# Patient Record
Sex: Female | Born: 1970 | Race: White | Hispanic: No | Marital: Married | Smoking: Never smoker
Health system: Southern US, Community
[De-identification: ages and names within clinical notes are randomized; demographics above are authoritative.]

## PROBLEM LIST (undated history)

## (undated) DIAGNOSIS — B9681 Helicobacter pylori [H. pylori] as the cause of diseases classified elsewhere: Secondary | ICD-10-CM

## (undated) DIAGNOSIS — F329 Major depressive disorder, single episode, unspecified: Secondary | ICD-10-CM

## (undated) DIAGNOSIS — F32A Depression, unspecified: Secondary | ICD-10-CM

## (undated) DIAGNOSIS — G43909 Migraine, unspecified, not intractable, without status migrainosus: Secondary | ICD-10-CM

## (undated) DIAGNOSIS — D039 Melanoma in situ, unspecified: Secondary | ICD-10-CM

## (undated) DIAGNOSIS — K279 Peptic ulcer, site unspecified, unspecified as acute or chronic, without hemorrhage or perforation: Secondary | ICD-10-CM

## (undated) DIAGNOSIS — C801 Malignant (primary) neoplasm, unspecified: Secondary | ICD-10-CM

---

## 2008-03-04 ENCOUNTER — Encounter: Payer: Self-pay | Admitting: Family Medicine

## 2008-06-03 ENCOUNTER — Ambulatory Visit: Payer: Self-pay | Admitting: Family Medicine

## 2008-06-03 ENCOUNTER — Encounter: Admission: RE | Admit: 2008-06-03 | Discharge: 2008-06-03 | Payer: Self-pay | Admitting: Family Medicine

## 2008-06-03 DIAGNOSIS — F329 Major depressive disorder, single episode, unspecified: Secondary | ICD-10-CM

## 2008-06-03 DIAGNOSIS — L659 Nonscarring hair loss, unspecified: Secondary | ICD-10-CM | POA: Insufficient documentation

## 2008-06-03 DIAGNOSIS — R5381 Other malaise: Secondary | ICD-10-CM

## 2008-06-03 DIAGNOSIS — R5383 Other fatigue: Secondary | ICD-10-CM

## 2008-06-03 DIAGNOSIS — F3289 Other specified depressive episodes: Secondary | ICD-10-CM | POA: Insufficient documentation

## 2008-06-03 DIAGNOSIS — G43009 Migraine without aura, not intractable, without status migrainosus: Secondary | ICD-10-CM | POA: Insufficient documentation

## 2008-06-04 LAB — CONVERTED CEMR LAB
CO2: 25 meq/L (ref 19–32)
Creatinine, Ser: 0.84 mg/dL (ref 0.40–1.20)
Glucose, Bld: 88 mg/dL (ref 70–99)
Sodium: 140 meq/L (ref 135–145)
TSH: 1.204 microintl units/mL (ref 0.350–4.50)

## 2008-06-18 ENCOUNTER — Ambulatory Visit (HOSPITAL_COMMUNITY): Payer: Self-pay | Admitting: Psychology

## 2008-06-23 ENCOUNTER — Ambulatory Visit (HOSPITAL_COMMUNITY): Payer: Self-pay | Admitting: Psychology

## 2008-07-01 ENCOUNTER — Encounter: Admission: RE | Admit: 2008-07-01 | Discharge: 2008-07-01 | Payer: Self-pay | Admitting: Dermatology

## 2008-07-01 ENCOUNTER — Ambulatory Visit: Payer: Self-pay | Admitting: Family Medicine

## 2008-07-09 ENCOUNTER — Ambulatory Visit (HOSPITAL_COMMUNITY): Payer: Self-pay | Admitting: Psychology

## 2008-07-12 ENCOUNTER — Telehealth: Payer: Self-pay | Admitting: Family Medicine

## 2008-07-13 ENCOUNTER — Ambulatory Visit: Payer: Self-pay | Admitting: Family Medicine

## 2008-07-13 DIAGNOSIS — R42 Dizziness and giddiness: Secondary | ICD-10-CM

## 2008-07-13 LAB — CONVERTED CEMR LAB: Hemoglobin: 15.3 g/dL

## 2008-07-16 ENCOUNTER — Encounter: Payer: Self-pay | Admitting: Family Medicine

## 2008-07-19 ENCOUNTER — Telehealth: Payer: Self-pay | Admitting: Family Medicine

## 2008-07-21 ENCOUNTER — Ambulatory Visit (HOSPITAL_COMMUNITY): Payer: Self-pay | Admitting: Psychology

## 2008-07-30 ENCOUNTER — Ambulatory Visit (HOSPITAL_COMMUNITY): Payer: Self-pay | Admitting: Psychology

## 2008-08-05 ENCOUNTER — Ambulatory Visit: Payer: Self-pay | Admitting: Family Medicine

## 2008-08-09 ENCOUNTER — Ambulatory Visit (HOSPITAL_COMMUNITY): Payer: Self-pay | Admitting: Psychology

## 2008-08-14 ENCOUNTER — Encounter: Admission: RE | Admit: 2008-08-14 | Discharge: 2008-08-14 | Payer: Self-pay | Admitting: Neurology

## 2008-08-26 ENCOUNTER — Ambulatory Visit (HOSPITAL_COMMUNITY): Payer: Self-pay | Admitting: Psychology

## 2008-09-28 ENCOUNTER — Ambulatory Visit (HOSPITAL_COMMUNITY): Payer: Self-pay | Admitting: Psychology

## 2008-10-12 ENCOUNTER — Ambulatory Visit (HOSPITAL_COMMUNITY): Payer: Self-pay | Admitting: Psychology

## 2008-11-03 ENCOUNTER — Ambulatory Visit: Payer: Self-pay | Admitting: Family Medicine

## 2008-12-09 ENCOUNTER — Ambulatory Visit (HOSPITAL_COMMUNITY): Payer: Self-pay | Admitting: Psychology

## 2008-12-29 ENCOUNTER — Ambulatory Visit (HOSPITAL_COMMUNITY): Payer: Self-pay | Admitting: Psychology

## 2009-01-05 ENCOUNTER — Telehealth (INDEPENDENT_AMBULATORY_CARE_PROVIDER_SITE_OTHER): Payer: Self-pay | Admitting: *Deleted

## 2009-01-11 ENCOUNTER — Ambulatory Visit: Payer: Self-pay | Admitting: Family Medicine

## 2009-01-12 LAB — CONVERTED CEMR LAB
Eosinophils Absolute: 0 10*3/uL (ref 0.0–0.7)
Ferritin: 33.4 ng/mL (ref 10.0–291.0)
Folate: 6 ng/mL
Free T4: 0.6 ng/dL (ref 0.6–1.6)
HCT: 41.3 % (ref 36.0–46.0)
Hemoglobin: 14.7 g/dL (ref 12.0–15.0)
Lymphocytes Relative: 24.6 % (ref 12.0–46.0)
MCV: 89.9 fL (ref 78.0–100.0)
Platelets: 152 10*3/uL (ref 150.0–400.0)
RBC: 4.59 M/uL (ref 3.87–5.11)
Saturation Ratios: 23.9 % (ref 20.0–50.0)
T3, Free: 2.8 pg/mL (ref 2.3–4.2)
TSH: 1.41 microintl units/mL (ref 0.35–5.50)
Transferrin: 200.1 mg/dL — ABNORMAL LOW (ref 212.0–360.0)

## 2009-01-14 ENCOUNTER — Ambulatory Visit (HOSPITAL_COMMUNITY): Payer: Self-pay | Admitting: Psychology

## 2009-01-24 ENCOUNTER — Other Ambulatory Visit (HOSPITAL_COMMUNITY): Admission: RE | Admit: 2009-01-24 | Discharge: 2009-02-01 | Payer: Self-pay | Admitting: Psychiatry

## 2009-01-25 ENCOUNTER — Telehealth: Payer: Self-pay | Admitting: Family Medicine

## 2009-01-26 ENCOUNTER — Ambulatory Visit: Payer: Self-pay | Admitting: Psychiatry

## 2009-02-03 ENCOUNTER — Ambulatory Visit (HOSPITAL_COMMUNITY): Payer: Self-pay | Admitting: Psychology

## 2009-03-29 ENCOUNTER — Ambulatory Visit (HOSPITAL_COMMUNITY): Payer: Self-pay | Admitting: Psychology

## 2009-04-13 ENCOUNTER — Ambulatory Visit (HOSPITAL_COMMUNITY): Payer: Self-pay | Admitting: Psychology

## 2009-04-27 ENCOUNTER — Ambulatory Visit (HOSPITAL_COMMUNITY): Payer: Self-pay | Admitting: Psychology

## 2009-05-10 ENCOUNTER — Ambulatory Visit (HOSPITAL_COMMUNITY): Payer: Self-pay | Admitting: Psychology

## 2009-06-07 ENCOUNTER — Ambulatory Visit (HOSPITAL_COMMUNITY): Payer: Self-pay | Admitting: Psychology

## 2009-06-28 ENCOUNTER — Ambulatory Visit (HOSPITAL_COMMUNITY): Payer: Self-pay | Admitting: Psychology

## 2009-07-28 ENCOUNTER — Ambulatory Visit (HOSPITAL_COMMUNITY): Payer: Self-pay | Admitting: Psychology

## 2009-08-09 ENCOUNTER — Ambulatory Visit (HOSPITAL_COMMUNITY): Payer: Self-pay | Admitting: Psychology

## 2009-09-07 ENCOUNTER — Ambulatory Visit (HOSPITAL_COMMUNITY): Payer: Self-pay | Admitting: Psychology

## 2009-09-15 ENCOUNTER — Encounter: Admission: RE | Admit: 2009-09-15 | Discharge: 2009-09-15 | Payer: Self-pay | Admitting: Dermatology

## 2009-09-28 ENCOUNTER — Ambulatory Visit (HOSPITAL_COMMUNITY): Payer: Self-pay | Admitting: Psychology

## 2009-10-27 ENCOUNTER — Ambulatory Visit (HOSPITAL_COMMUNITY): Payer: Self-pay | Admitting: Psychology

## 2010-02-08 IMAGING — CR DG CHEST 2V
2 series · 2 of 2 positions shown · non-contrast
Comparison: 

CLINICAL DATA: Melanoma

CHEST - 2 VIEW

[view not recorded (1 of 2)]
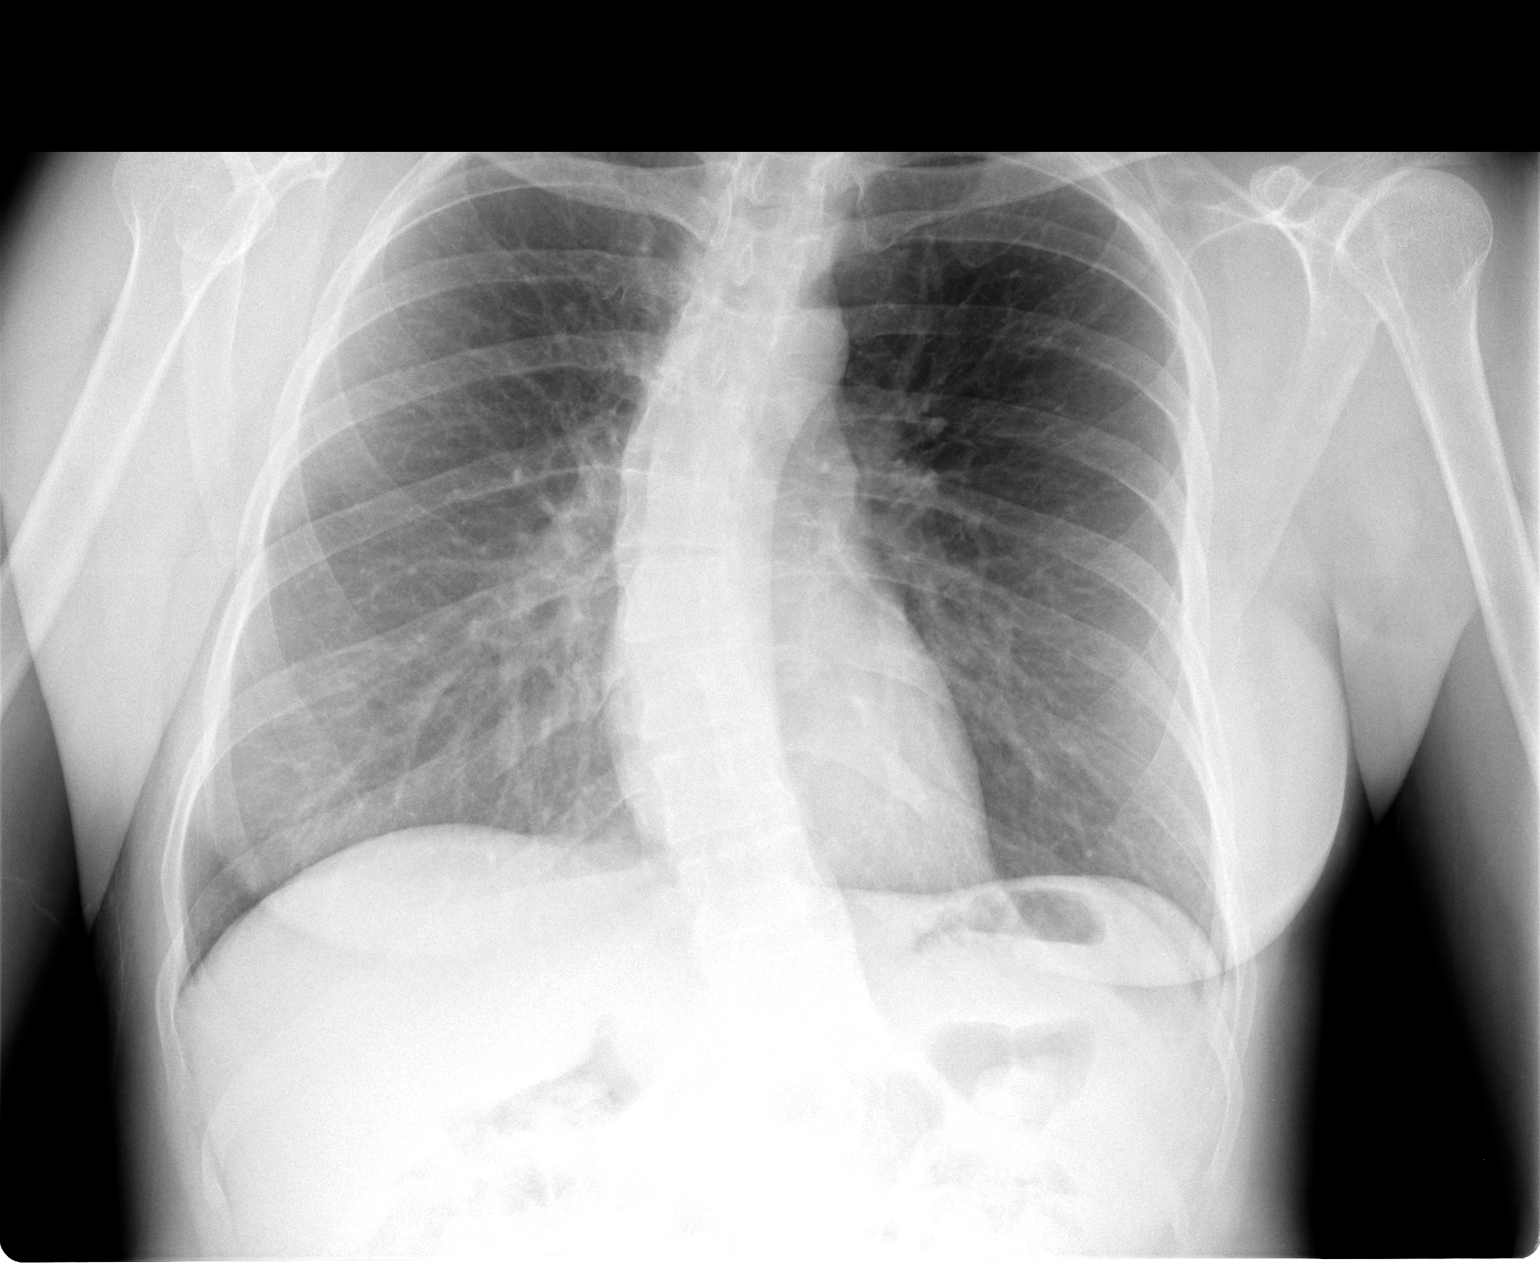

[view not recorded (2 of 2)]
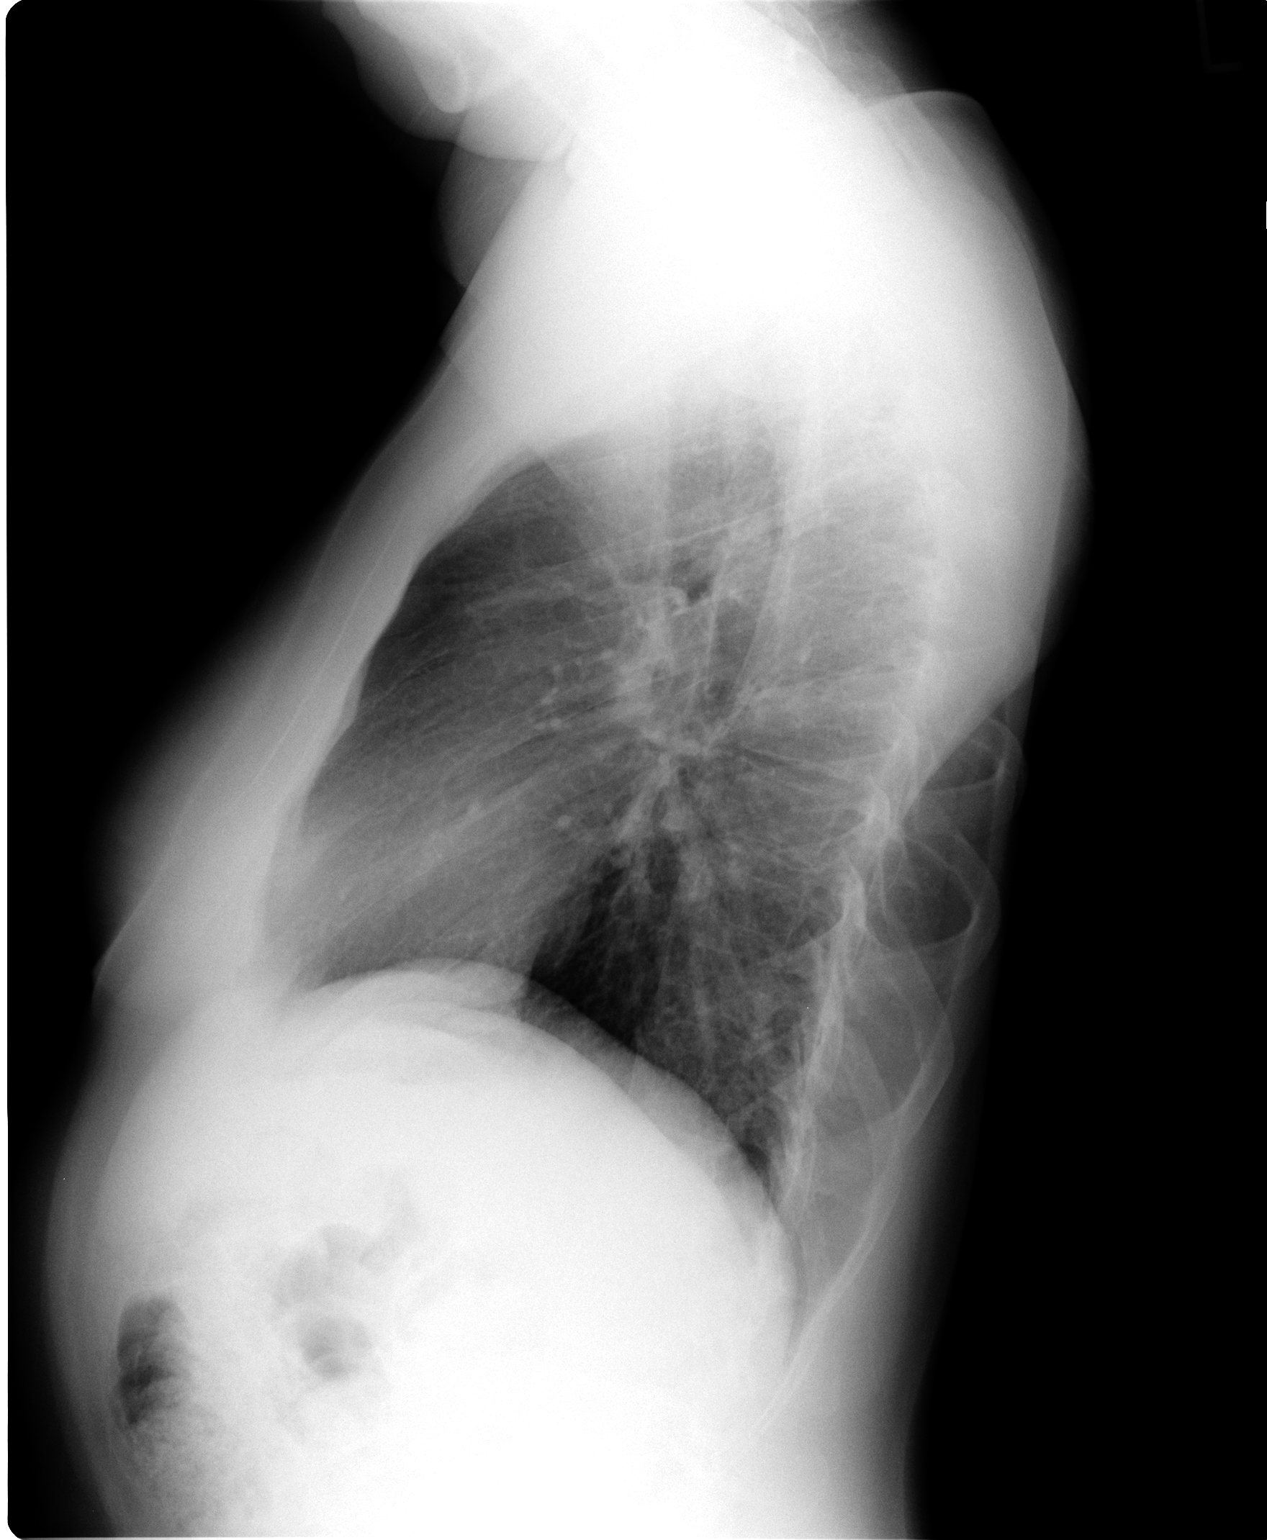

[2 of 2 positions shown; findings below may reference images not displayed]

FINDINGS: The heart size and mediastinal contours are within normal
limits.  Both lungs are clear. No nodules or masses. Thoracic
dextroscoliosis.
IMPRESSION: No active cardiopulmonary disease.

## 2010-02-24 ENCOUNTER — Encounter: Admission: RE | Admit: 2010-02-24 | Discharge: 2010-02-24 | Payer: Self-pay | Admitting: Dermatology

## 2010-02-27 ENCOUNTER — Ambulatory Visit (HOSPITAL_COMMUNITY): Payer: Self-pay | Admitting: Psychology

## 2010-03-06 ENCOUNTER — Encounter: Payer: Self-pay | Admitting: Family Medicine

## 2010-03-07 ENCOUNTER — Ambulatory Visit (HOSPITAL_COMMUNITY): Payer: Self-pay | Admitting: Psychology

## 2010-03-27 ENCOUNTER — Encounter: Payer: Self-pay | Admitting: Family Medicine

## 2010-04-04 ENCOUNTER — Ambulatory Visit (HOSPITAL_COMMUNITY): Payer: Self-pay | Admitting: Psychology

## 2010-05-18 ENCOUNTER — Encounter: Payer: Self-pay | Admitting: Family Medicine

## 2010-05-31 ENCOUNTER — Encounter: Payer: Self-pay | Admitting: Family Medicine

## 2010-07-19 ENCOUNTER — Encounter: Payer: Self-pay | Admitting: Family Medicine

## 2010-09-19 NOTE — Letter (Signed)
Summary: [facility]  [facility]   Imported By: Lanelle Bal 04/04/2010 12:07:03  _____________________________________________________________________  External Attachment:    Type:   Image     Comment:   External Document

## 2010-09-19 NOTE — Letter (Signed)
Summary: [facility]  [facility]   Imported By: Lanelle Bal 06/02/2010 10:29:11  _____________________________________________________________________  External Attachment:    Type:   Image     Comment:   External Document

## 2010-09-19 NOTE — Letter (Signed)
Summary: [facility]   Imported By: Maryln Gottron 07/28/2010 12:21:14  _____________________________________________________________________  External Attachment:    Type:   Image     Comment:   External Document

## 2010-09-19 NOTE — Miscellaneous (Signed)
Summary: colonoscopy  Clinical Lists Changes  Observations: Added new observation of COLONRECACT: Repeat colonoscopy in 5 years.  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    o/w normal.   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    o/w normal.    Comments:      Repeat colonoscopy in 5 years.    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    o/w normal.    Comments:      Repeat colonoscopy in 5 years.

## 2010-09-19 NOTE — Letter (Signed)
Summary: [facility]  [facility]   Imported By: Lanelle Bal 04/19/2010 14:10:48  _____________________________________________________________________  External Attachment:    Type:   Image     Comment:   External Document

## 2010-09-28 ENCOUNTER — Encounter: Payer: Self-pay | Admitting: Family Medicine

## 2010-09-29 ENCOUNTER — Encounter: Payer: Self-pay | Admitting: Family Medicine

## 2010-09-29 ENCOUNTER — Ambulatory Visit (INDEPENDENT_AMBULATORY_CARE_PROVIDER_SITE_OTHER): Payer: BC Managed Care – PPO | Admitting: Family Medicine

## 2010-09-29 DIAGNOSIS — J209 Acute bronchitis, unspecified: Secondary | ICD-10-CM | POA: Insufficient documentation

## 2010-10-05 NOTE — Assessment & Plan Note (Signed)
Summary: bronchitis   Vital Signs:  Patient profile:   40 year old female Height:      61.5 inches Weight:      148 pounds BMI:     27.61 O2 Sat:      98 % on Room air Temp:     98.5 degrees F oral Pulse rate:   74 / minute BP sitting:   105 / 75  (left arm) Cuff size:   regular  Vitals Entered By: Payton Spark CMA (September 29, 2010 1:47 PM)  O2 Flow:  Room air CC: Cough x 1 week.    Primary Care Provider:  Seymour Bars DO  CC:  Cough x 1 week. Marland Kitchen  History of Present Illness: 40 yo WF presents for a cough that started 7 days ago.  She felt bad at the onset iwth sore throat, congestion and malaise.  She ran on Sunday and started to cough deeper on Monday.  her cough became productive on Monday but she o/w felt OK.  She started to have more frequent cough yesterday with poor sleep last night.  She took Robitussin which has helped some.  She has some chest tightness and rib pain iwth coughing but no SOB.  She is not a smoker and has never had asthma.  Has some rhinorrhea but not much head congestion.  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  Current Medications (verified): 1)  Spironolactone 50 Mg Tabs (Spironolactone) .... Take 1 Tablet By Mouth Once A Day 2)  Zyrtec Allergy 10 Mg Tabs (Cetirizine Hcl) .... Take 1 Tablet By Mouth Once A Day 3)  Vitamin E Complex 400 Unit Caps (Vitamin E) .... Take 1 Tablet By Mouth Once A Day 4)  Maxalt 10 Mg Tabs (Rizatriptan Benzoate) .... Take One By Mouth For Migraine,may Repeat Times One in 2hours If Needed 5)  B2 100 Mg Tabs (Riboflavin) .... Take Two By Orbie Hurst Twice Daily 6)  Melatonin 1 Mg Caps (Melatonin) .... 1/2 Tab By Mouth Qam 7)  Pristiq 100 Mg Xr24h-Tab (Desvenlafaxine Succinate) .... Take 1 Tab By Mouth Once Daily 8)  Abilify 5 Mg Tabs (Aripiprazole) .... Take 1 Tab By Mouth At Bedtime 9)  Zonegran 100 Mg Caps (Zonisamide) .... Take 1 Cap By Mouth At  Bedtime 10)  Asacol Hd 800 Mg Tbec (Mesalamine) .... Take 1 Tab By Mouth Three Times A Day  Allergies (verified): No Known Drug Allergies  Past History:  Past Medical History: Reviewed history from 11/03/2008 and no changes required. 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 Rubie Maid person. Has twin sons, married.   9-06 Mirena placed.  Review of Systems      See HPI  Physical Exam  General:  alert, well-developed, well-nourished, and well-hydrated.   Head:  normocephalic and atraumatic.   Eyes:  conjunctiva clear Ears:  EACs patent; TMs translucent and gray with good cone of light and bony landmarks.  Nose:  nasal congestion present with scant rhinorrhea Mouth:  o/p with minimal injection Neck:  no masses.   Lungs:  Normal respiratory effort, chest expands symmetrically. Lungs are clear to auscultation, no crackles or wheezes.  L basilar rhonchi esp with  cough Heart:  Normal rate and regular rhythm. S1 and S2 normal without gallop, murmur, click, rub or other extra sounds. Skin:  color normal and no rashes.   Cervical Nodes:  No lymphadenopathy noted   Impression & Recommendations:  Problem # 1:  ACUTE BRONCHITIS (ICD-466.0) Bronchitis likely viral, day 7-8 of illness.  Treat with supportive care, adding nighttime cough mediicine.  If not improved in another 4-5 days, RX for Zithromax given to start.   Her updated medication list for this problem includes:    Zithromax Z-pak 250 Mg Tabs (Azithromycin) .Marland Kitchen... 2 tabs by mouth once daily x 1 day then 1 tab by mouth daily x 4 days    Tussionex Pennkinetic Er 10-8 Mg/72ml Lqcr (Hydrocod polst-chlorphen polst) .Marland Kitchen... 1 teaspoon by mouth at bedtime as needed cough  Complete Medication List: 1)  Spironolactone  50 Mg Tabs (Spironolactone) .... Take 1 tablet by mouth once a day 2)  Zyrtec Allergy 10 Mg Tabs (Cetirizine hcl) .... Take 1 tablet by mouth once a day 3)  Vitamin E Complex 400 Unit Caps (Vitamin e) .... Take 1 tablet by mouth once a day 4)  Maxalt 10 Mg Tabs (Rizatriptan benzoate) .... Take one by mouth for migraine,may repeat times one in 2hours if needed 5)  B2 100 Mg Tabs (Riboflavin) .... Take two by moth twice daily 6)  Melatonin 1 Mg Caps (Melatonin) .... 1/2 tab by mouth qam 7)  Pristiq 100 Mg Xr24h-tab (Desvenlafaxine succinate) .... Take 1 tab by mouth once daily 8)  Abilify 5 Mg Tabs (Aripiprazole) .... Take 1 tab by mouth at bedtime 9)  Zonegran 100 Mg Caps (Zonisamide) .... Take 1 cap by mouth at bedtime 10)  Asacol Hd 800 Mg Tbec (Mesalamine) .... Take 1 tab by mouth three times a day 11)  Zithromax Z-pak 250 Mg Tabs (Azithromycin) .... 2 tabs by mouth once daily x 1 day then 1 tab by mouth daily x 4 days 12)  Tussionex Pennkinetic Er 10-8 Mg/62ml Lqcr (Hydrocod polst-chlorphen polst) .Marland Kitchen.. 1 teaspoon by mouth at bedtime as needed cough  Patient Instructions: 1)  Take 5 days of Zithromax for bronchitis. 2)  Use Tussionex at night for cough and Delsym during the day. 3)  Call if not improved in 10 days. Prescriptions: TUSSIONEX PENNKINETIC ER 10-8 MG/5ML LQCR (HYDROCOD POLST-CHLORPHEN POLST) 1 teaspoon by mouth at bedtime as needed cough  #100 ml x 0   Entered and Authorized by:   Seymour Bars DO   Signed by:   Seymour Bars DO on 09/29/2010   Method used:   Printed then faxed to ...       [facility]* (retail)       [address]       [place], [place]  [address]       Ph: [phone]       Fax: [phone]   RxID:   [id number] ZITHROMAX Z-PAK 250 MG TABS (AZITHROMYCIN) 2 tabs by mouth once daily x 1 day then 1 tab by mouth daily x 4 days  #1 pack x 0   Entered and Authorized by:   Seymour Bars DO   Signed by:   Seymour Bars DO on  09/29/2010   Method used:   Electronically to        [facility]* (retail)       [address]       [employer]  [address]       [place], [place]  [address]       Ph: [phone]       Fax: [phone]   RxID:   [id number]    Orders Added: 1)  Est. Patient Level III [84132]

## 2010-10-17 NOTE — Letter (Signed)
Summary: [facility]  [facility]   Imported By: Lanelle Bal 10/11/2010 13:37:04  _____________________________________________________________________  External Attachment:    Type:   Image     Comment:   External Document

## 2011-04-11 ENCOUNTER — Other Ambulatory Visit: Payer: Self-pay | Admitting: Dermatology

## 2011-04-11 ENCOUNTER — Ambulatory Visit
Admission: RE | Admit: 2011-04-11 | Discharge: 2011-04-11 | Disposition: A | Discharge status: Home / Self Care | Payer: BC Managed Care – PPO | Source: Ambulatory Visit | Attending: Dermatology | Admitting: Dermatology

## 2011-04-11 DIAGNOSIS — C439 Malignant melanoma of skin, unspecified: Secondary | ICD-10-CM

## 2013-08-29 ENCOUNTER — Emergency Department (HOSPITAL_BASED_OUTPATIENT_CLINIC_OR_DEPARTMENT_OTHER)
Admission: EM | Admit: 2013-08-29 | Discharge: 2013-08-29 | Disposition: A | Discharge status: Home / Self Care | Payer: BC Managed Care – PPO | Attending: Emergency Medicine | Admitting: Emergency Medicine

## 2013-08-29 ENCOUNTER — Emergency Department (HOSPITAL_BASED_OUTPATIENT_CLINIC_OR_DEPARTMENT_OTHER): Payer: BC Managed Care – PPO

## 2013-08-29 ENCOUNTER — Encounter (HOSPITAL_BASED_OUTPATIENT_CLINIC_OR_DEPARTMENT_OTHER): Payer: Self-pay | Admitting: Emergency Medicine

## 2013-08-29 DIAGNOSIS — G43909 Migraine, unspecified, not intractable, without status migrainosus: Secondary | ICD-10-CM | POA: Insufficient documentation

## 2013-08-29 DIAGNOSIS — F3289 Other specified depressive episodes: Secondary | ICD-10-CM | POA: Insufficient documentation

## 2013-08-29 DIAGNOSIS — K56609 Unspecified intestinal obstruction, unspecified as to partial versus complete obstruction: Secondary | ICD-10-CM | POA: Insufficient documentation

## 2013-08-29 DIAGNOSIS — Z8619 Personal history of other infectious and parasitic diseases: Secondary | ICD-10-CM | POA: Insufficient documentation

## 2013-08-29 DIAGNOSIS — F329 Major depressive disorder, single episode, unspecified: Secondary | ICD-10-CM | POA: Insufficient documentation

## 2013-08-29 DIAGNOSIS — Z85820 Personal history of malignant melanoma of skin: Secondary | ICD-10-CM | POA: Insufficient documentation

## 2013-08-29 DIAGNOSIS — Z79899 Other long term (current) drug therapy: Secondary | ICD-10-CM | POA: Insufficient documentation

## 2013-08-29 DIAGNOSIS — K50012 Crohn's disease of small intestine with intestinal obstruction: Secondary | ICD-10-CM

## 2013-08-29 DIAGNOSIS — Z3202 Encounter for pregnancy test, result negative: Secondary | ICD-10-CM | POA: Insufficient documentation

## 2013-08-29 DIAGNOSIS — K5000 Crohn's disease of small intestine without complications: Secondary | ICD-10-CM | POA: Insufficient documentation

## 2013-08-29 HISTORY — DX: Peptic ulcer, site unspecified, unspecified as acute or chronic, without hemorrhage or perforation: K27.9

## 2013-08-29 HISTORY — DX: Major depressive disorder, single episode, unspecified: F32.9

## 2013-08-29 HISTORY — DX: Depression, unspecified: F32.A

## 2013-08-29 HISTORY — DX: Migraine, unspecified, not intractable, without status migrainosus: G43.909

## 2013-08-29 HISTORY — DX: Helicobacter pylori (H. pylori) as the cause of diseases classified elsewhere: B96.81

## 2013-08-29 HISTORY — DX: Melanoma in situ, unspecified: D03.9

## 2013-08-29 LAB — URINALYSIS, ROUTINE W REFLEX MICROSCOPIC
BILIRUBIN URINE: NEGATIVE
Glucose, UA: NEGATIVE mg/dL
HGB URINE DIPSTICK: NEGATIVE
KETONES UR: NEGATIVE mg/dL
Leukocytes, UA: NEGATIVE
Nitrite: NEGATIVE
PH: 6 (ref 5.0–8.0)
PROTEIN: NEGATIVE mg/dL
SPECIFIC GRAVITY, URINE: 1.02 (ref 1.005–1.030)
Urobilinogen, UA: 0.2 mg/dL (ref 0.0–1.0)

## 2013-08-29 LAB — COMPREHENSIVE METABOLIC PANEL
ALBUMIN: 4 g/dL (ref 3.5–5.2)
ALK PHOS: 86 U/L (ref 39–117)
ALT: 20 U/L (ref 0–35)
AST: 24 U/L (ref 0–37)
BILIRUBIN TOTAL: 0.6 mg/dL (ref 0.3–1.2)
BUN: 13 mg/dL (ref 6–23)
CALCIUM: 9.2 mg/dL (ref 8.4–10.5)
CHLORIDE: 104 meq/L (ref 96–112)
CO2: 24 mEq/L (ref 19–32)
CREATININE: 0.9 mg/dL (ref 0.50–1.10)
GFR calc non Af Amer: 78 mL/min — ABNORMAL LOW (ref >90)
GLUCOSE: 134 mg/dL — AB (ref 70–99)
POTASSIUM: 4.4 meq/L (ref 3.7–5.3)
SODIUM: 140 meq/L (ref 137–147)
TOTAL PROTEIN: 7.4 g/dL (ref 6.0–8.3)

## 2013-08-29 LAB — WET PREP, GENITAL
Clue Cells Wet Prep HPF POC: NONE SEEN
TRICH WET PREP: NONE SEEN
Yeast Wet Prep HPF POC: NONE SEEN

## 2013-08-29 LAB — CBC WITH DIFFERENTIAL/PLATELET
BASOS PCT: 0 % (ref 0–1)
Basophils Absolute: 0 10*3/uL (ref 0.0–0.1)
Eosinophils Absolute: 0 10*3/uL (ref 0.0–0.7)
Eosinophils Relative: 0 % (ref 0–5)
HEMATOCRIT: 45.3 % (ref 36.0–46.0)
HEMOGLOBIN: 15.8 g/dL — AB (ref 12.0–15.0)
LYMPHS PCT: 9 % — AB (ref 12–46)
Lymphs Abs: 1.3 10*3/uL (ref 0.7–4.0)
MCH: 30.3 pg (ref 26.0–34.0)
MCHC: 34.9 g/dL (ref 30.0–36.0)
MCV: 86.8 fL (ref 78.0–100.0)
MONO ABS: 0.3 10*3/uL (ref 0.1–1.0)
MONOS PCT: 2 % — AB (ref 3–12)
NEUTROS ABS: 12.9 10*3/uL — AB (ref 1.7–7.7)
Neutrophils Relative %: 88 % — ABNORMAL HIGH (ref 43–77)
PLATELETS: 205 10*3/uL (ref 150–400)
RBC: 5.22 MIL/uL — ABNORMAL HIGH (ref 3.87–5.11)
RDW: 12.9 % (ref 11.5–15.5)
WBC: 14.6 10*3/uL — ABNORMAL HIGH (ref 4.0–10.5)

## 2013-08-29 LAB — PREGNANCY, URINE: PREG TEST UR: NEGATIVE

## 2013-08-29 LAB — LIPASE, BLOOD: Lipase: 49 U/L (ref 11–59)

## 2013-08-29 MED ORDER — ONDANSETRON HCL 4 MG/2ML IJ SOLN
4.0000 mg | Freq: Once | INTRAMUSCULAR | Status: AC
  Administered 2013-08-29: 4 mg via INTRAVENOUS
  Filled 2013-08-29: qty 2

## 2013-08-29 MED ORDER — FENTANYL CITRATE 0.05 MG/ML IJ SOLN
100.0000 ug | Freq: Once | INTRAMUSCULAR | Status: AC
  Administered 2013-08-29: 100 ug via INTRAVENOUS

## 2013-08-29 MED ORDER — FENTANYL CITRATE 0.05 MG/ML IJ SOLN
100.0000 ug | Freq: Once | INTRAMUSCULAR | Status: AC
  Administered 2013-08-29: 100 ug via INTRAVENOUS
  Filled 2013-08-29: qty 2

## 2013-08-29 MED ORDER — METOCLOPRAMIDE HCL 5 MG/ML IJ SOLN
INTRAMUSCULAR | Status: AC
  Filled 2013-08-29: qty 2

## 2013-08-29 MED ORDER — IOHEXOL 300 MG/ML  SOLN
100.0000 mL | Freq: Once | INTRAMUSCULAR | Status: AC | PRN
  Administered 2013-08-29: 100 mL via INTRAVENOUS

## 2013-08-29 MED ORDER — IOHEXOL 300 MG/ML  SOLN
50.0000 mL | Freq: Once | INTRAMUSCULAR | Status: AC | PRN
  Administered 2013-08-29: 50 mL via ORAL

## 2013-08-29 MED ORDER — ONDANSETRON 4 MG PO TBDP
4.0000 mg | ORAL_TABLET | Freq: Once | ORAL | Status: AC
  Administered 2013-08-29: 4 mg via ORAL

## 2013-08-29 MED ORDER — METOCLOPRAMIDE HCL 5 MG/ML IJ SOLN
10.0000 mg | Freq: Once | INTRAMUSCULAR | Status: AC
  Administered 2013-08-29: 10 mg via INTRAVENOUS

## 2013-08-29 MED ORDER — ONDANSETRON 4 MG PO TBDP
ORAL_TABLET | ORAL | Status: AC
  Administered 2013-08-29: 4 mg via ORAL
  Filled 2013-08-29: qty 1

## 2013-08-29 MED ORDER — FENTANYL CITRATE 0.05 MG/ML IJ SOLN
INTRAMUSCULAR | Status: AC
  Administered 2013-08-29: 100 ug via INTRAVENOUS
  Filled 2013-08-29: qty 2

## 2013-08-29 MED ORDER — SODIUM CHLORIDE 0.9 % IV SOLN
INTRAVENOUS | Status: DC
  Administered 2013-08-29: 03:00:00 via INTRAVENOUS

## 2013-08-29 NOTE — ED Notes (Signed)
Pt reports she has had abdominal pain for several months, dx with h pylori in November, started on therapy, completed therapy, however abdominal pain has returned, pt reports seeing pcp Monday who referred pt to [facility] specialist

## 2013-08-29 NOTE — ED Provider Notes (Signed)
CSN: [id number]     Arrival date & time 08/29/13  0108 History   First MD Initiated Contact with Patient 08/29/13 0242     Chief Complaint  Patient presents with  . Abdominal Pain   (Consider location/radiation/quality/duration/timing/severity/associated sxs/prior Treatment) HPI This is a 43 year old female who was diagnosed with Helicobacter pylori in November and treated with a course of antibiotics. She has had a return of her abdominal pain over the past week. She saw her PCP 5 days ago who referred her to a gastroenterologist whom she has not yet seen. She is here with moderate to severe abdominal pain that began yesterday evening about 10 PM. It is located in the epigastrium and suprapubic regions. There is a burning component similar to previous pain but there is also a new crampy component that is intermittent. There is no significant change with movement or palpation. She has had some nausea and vomiting but no diarrhea. She denies fever, chills, shortness of breath or chest pain.   Past Medical History  Diagnosis Date  . H pylori ulcer   . Depression   . Melanoma in situ   . Migraine    History reviewed. No pertinent past surgical history. History reviewed. No pertinent family history. History  Substance Use Topics  . Smoking status: Never Smoker   . Smokeless tobacco: Not on file  . Alcohol Use: Yes     Comment: ocassionally   OB History   Grav Para Term Preterm Abortions TAB SAB Ect Mult Living                 Review of Systems  All other systems reviewed and are negative.    Allergies  Amitriptyline hcl and Minocycline  Home Medications   Current Outpatient Rx  Name  Route  Sig  Dispense  Refill  . cetirizine (ZYRTEC) 10 MG tablet   Oral   Take 10 mg by mouth daily.         Marland Kitchen desvenlafaxine (PRISTIQ) 50 MG 24 hr tablet   Oral   Take 50 mg by mouth daily.         . pantoprazole (PROTONIX) 40 MG tablet   Oral   Take 40 mg by mouth daily.          Marland Kitchen rOPINIRole (REQUIP) 2 MG tablet   Oral   Take 2 mg by mouth at bedtime.         Marland Kitchen spironolactone (ALDACTONE) 50 MG tablet   Oral   Take 50 mg by mouth daily.         Marland Kitchen zonisamide (ZONEGRAN) 100 MG capsule   Oral   Take 200 mg by mouth daily.          BP 138/92  Pulse 69  Temp(Src) 98.5 F (36.9 C) (Oral)  Resp 16  Ht 5\' 2"  (1.575 m)  Wt 130 lb (58.968 kg)  BMI 23.77 kg/m2  SpO2 100%  Physical Exam General: Well-developed, well-nourished female in no acute distress; appearance consistent with age of record; appears uncomfortable HENT: normocephalic; atraumatic Eyes: pupils equal, round and reactive to light; extraocular muscles intact Neck: supple Heart: regular rate and rhythm; no murmurs, rubs or gallops Lungs: clear to auscultation bilaterally Abdomen: soft; nondistended; suprapubic tenderness greater than epigastric tenderness; negative Murphy's sign; no masses or hepatosplenomegaly; bowel sounds present GU: Normal external genitalia; vaginal bleeding; no vaginal discharge; no cervical motion tenderness; no adnexal tenderness Extremities: No deformity; full range of motion Neurologic: Awake, alert and  oriented; motor function intact in all extremities and symmetric; no facial droop Skin: Warm and dry Psychiatric: Flat affect    ED Course  Procedures (including critical care time)   MDM   Nursing notes and vitals signs, including pulse oximetry, reviewed.  Summary of this visit's results, reviewed by myself:  Labs:  Results for orders placed during the hospital encounter of 08/29/13 (from the past 24 hour(s))  URINALYSIS, ROUTINE W REFLEX MICROSCOPIC     Status: None   Collection Time    08/29/13  1:17 AM      Result Value Range   Color, Urine YELLOW  YELLOW   APPearance CLEAR  CLEAR   Specific Gravity, Urine 1.020  1.005 - 1.030   pH 6.0  5.0 - 8.0   Glucose, UA NEGATIVE  NEGATIVE mg/dL   Hgb urine dipstick NEGATIVE  NEGATIVE   Bilirubin Urine  NEGATIVE  NEGATIVE   Ketones, ur NEGATIVE  NEGATIVE mg/dL   Protein, ur NEGATIVE  NEGATIVE mg/dL   Urobilinogen, UA 0.2  0.0 - 1.0 mg/dL   Nitrite NEGATIVE  NEGATIVE   Leukocytes, UA NEGATIVE  NEGATIVE  PREGNANCY, URINE     Status: None   Collection Time    08/29/13  1:17 AM      Result Value Range   Preg Test, Ur NEGATIVE  NEGATIVE  LIPASE, BLOOD     Status: None   Collection Time    08/29/13  3:19 AM      Result Value Range   Lipase 49  11 - 59 U/L  COMPREHENSIVE METABOLIC PANEL     Status: Abnormal   Collection Time    08/29/13  3:19 AM      Result Value Range   Sodium 140  137 - 147 mEq/L   Potassium 4.4  3.7 - 5.3 mEq/L   Chloride 104  96 - 112 mEq/L   CO2 24  19 - 32 mEq/L   Glucose, Bld 134 (*) 70 - 99 mg/dL   BUN 13  6 - 23 mg/dL   Creatinine, Ser 0.90  0.50 - 1.10 mg/dL   Calcium 9.2  8.4 - 10.5 mg/dL   Total Protein 7.4  6.0 - 8.3 g/dL   Albumin 4.0  3.5 - 5.2 g/dL   AST 24  0 - 37 U/L   ALT 20  0 - 35 U/L   Alkaline Phosphatase 86  39 - 117 U/L   Total Bilirubin 0.6  0.3 - 1.2 mg/dL   GFR calc non Af Amer 78 (*) >90 mL/min   GFR calc Af Amer >90  >90 mL/min  CBC WITH DIFFERENTIAL     Status: Abnormal   Collection Time    08/29/13  3:19 AM      Result Value Range   WBC 14.6 (*) 4.0 - 10.5 K/uL   RBC 5.22 (*) 3.87 - 5.11 MIL/uL   Hemoglobin 15.8 (*) 12.0 - 15.0 g/dL   HCT 45.3  36.0 - 46.0 %   MCV 86.8  78.0 - 100.0 fL   MCH 30.3  26.0 - 34.0 pg   MCHC 34.9  30.0 - 36.0 g/dL   RDW 12.9  11.5 - 15.5 %   Platelets 205  150 - 400 K/uL   Neutrophils Relative % 88 (*) 43 - 77 %   Neutro Abs 12.9 (*) 1.7 - 7.7 K/uL   Lymphocytes Relative 9 (*) 12 - 46 %   Lymphs Abs 1.3  0.7 - 4.0  K/uL   Monocytes Relative 2 (*) 3 - 12 %   Monocytes Absolute 0.3  0.1 - 1.0 K/uL   Eosinophils Relative 0  0 - 5 %   Eosinophils Absolute 0.0  0.0 - 0.7 K/uL   Basophils Relative 0  0 - 1 %   Basophils Absolute 0.0  0.0 - 0.1 K/uL  WET PREP, GENITAL     Status: Abnormal    Collection Time    08/29/13  4:15 AM      Result Value Range   Yeast Wet Prep HPF POC NONE SEEN  NONE SEEN   Trich, Wet Prep NONE SEEN  NONE SEEN   Clue Cells Wet Prep HPF POC NONE SEEN  NONE SEEN   WBC, Wet Prep HPF POC FEW (*) NONE SEEN    Imaging Studies: Ct Abdomen Pelvis W Contrast  08/29/2013   CLINICAL DATA:  Abdominal pain for several months.  EXAM: CT ABDOMEN AND PELVIS WITH CONTRAST  TECHNIQUE: Multidetector CT imaging of the abdomen and pelvis was performed using the standard protocol following bolus administration of intravenous contrast.  CONTRAST:  50 mL OMNIPAQUE IOHEXOL 300 MG/ML SOLN, 100 mL OMNIPAQUE IOHEXOL 300 MG/ML SOLN  COMPARISON:  None.  FINDINGS: The lung bases are clear.  No pleural or pericardial effusion.  The gallbladder, liver, spleen, adrenal glands, pancreas and kidneys appear normal. There is wall thickening of the terminal ileum with fecal type contents within the distal ileum consistent with slow transit. Distal small bowel loops are dilated up to 3.5 cm. More proximal loops appear normal. Gas and stool are present throughout the colon. The appendix is unremarkable. The stomach appears normal. IUD is in place in the uterus. Is a small amount of free pelvic fluid with some interloop fluid is seen about the distal ileum. There is no lymphadenopathy. No fluid collection is identified.  IMPRESSION: Wall thickening of the terminal ileum compatible with infectious or inflammatory bowel disease such as Crohn's. There is dilatation of distal small bowel with fecal contents present in the ileum consistent with slow transit and mild, partial obstruction.   Electronically Signed   By: Inge Rise M.D.   On: 08/29/2013 06:22   6:56 AM Patient's pain and nausea control with IV medications. We will have her admitted to the [facility] in [place].     Wynetta Fines, MD 08/29/13 [phone]

## 2013-08-29 NOTE — ED Notes (Signed)
Patient transported to CT 

## 2013-08-29 NOTE — ED Notes (Signed)
MD at bedside. 

## 2013-08-31 LAB — GC/CHLAMYDIA PROBE AMP
CT Probe RNA: NEGATIVE
GC PROBE AMP APTIMA: NEGATIVE

## 2014-03-03 ENCOUNTER — Other Ambulatory Visit (HOSPITAL_BASED_OUTPATIENT_CLINIC_OR_DEPARTMENT_OTHER): Payer: Self-pay | Admitting: Dermatology

## 2014-03-03 ENCOUNTER — Ambulatory Visit (HOSPITAL_BASED_OUTPATIENT_CLINIC_OR_DEPARTMENT_OTHER)
Admission: RE | Admit: 2014-03-03 | Discharge: 2014-03-03 | Disposition: A | Discharge status: Home / Self Care | Payer: BC Managed Care – PPO | Source: Ambulatory Visit | Attending: Dermatology | Admitting: Dermatology

## 2014-03-03 DIAGNOSIS — C439 Malignant melanoma of skin, unspecified: Secondary | ICD-10-CM

## 2014-03-03 DIAGNOSIS — Z85820 Personal history of malignant melanoma of skin: Secondary | ICD-10-CM | POA: Insufficient documentation

## 2014-03-03 DIAGNOSIS — Z0000 Encounter for general adult medical examination without abnormal findings: Secondary | ICD-10-CM | POA: Insufficient documentation

## 2015-03-14 ENCOUNTER — Other Ambulatory Visit: Payer: Self-pay | Admitting: Dermatology

## 2015-03-14 ENCOUNTER — Ambulatory Visit
Admission: RE | Admit: 2015-03-14 | Discharge: 2015-03-14 | Disposition: A | Discharge status: Home / Self Care | Payer: BLUE CROSS/BLUE SHIELD | Source: Ambulatory Visit | Attending: Dermatology | Admitting: Dermatology

## 2015-03-14 DIAGNOSIS — C439 Malignant melanoma of skin, unspecified: Secondary | ICD-10-CM

## 2017-04-19 ENCOUNTER — Ambulatory Visit (INDEPENDENT_AMBULATORY_CARE_PROVIDER_SITE_OTHER): Payer: BLUE CROSS/BLUE SHIELD

## 2017-04-19 ENCOUNTER — Other Ambulatory Visit: Payer: Self-pay | Admitting: Dermatology

## 2017-04-19 DIAGNOSIS — M4185 Other forms of scoliosis, thoracolumbar region: Secondary | ICD-10-CM | POA: Diagnosis not present

## 2017-04-19 DIAGNOSIS — Z85820 Personal history of malignant melanoma of skin: Secondary | ICD-10-CM

## 2017-04-19 DIAGNOSIS — C439 Malignant melanoma of skin, unspecified: Secondary | ICD-10-CM

## 2017-06-16 ENCOUNTER — Emergency Department (HOSPITAL_BASED_OUTPATIENT_CLINIC_OR_DEPARTMENT_OTHER): Payer: BLUE CROSS/BLUE SHIELD

## 2017-06-16 ENCOUNTER — Inpatient Hospital Stay (HOSPITAL_BASED_OUTPATIENT_CLINIC_OR_DEPARTMENT_OTHER)
Admission: EM | Admit: 2017-06-16 | Discharge: 2017-06-19 | DRG: 390 | Disposition: A | Discharge status: Home / Self Care | Payer: BLUE CROSS/BLUE SHIELD | Attending: General Surgery | Admitting: General Surgery

## 2017-06-16 ENCOUNTER — Encounter (HOSPITAL_BASED_OUTPATIENT_CLINIC_OR_DEPARTMENT_OTHER): Payer: Self-pay

## 2017-06-16 DIAGNOSIS — K561 Intussusception: Principal | ICD-10-CM | POA: Diagnosis present

## 2017-06-16 DIAGNOSIS — Z800 Family history of malignant neoplasm of digestive organs: Secondary | ICD-10-CM

## 2017-06-16 DIAGNOSIS — K56609 Unspecified intestinal obstruction, unspecified as to partial versus complete obstruction: Secondary | ICD-10-CM | POA: Diagnosis present

## 2017-06-16 DIAGNOSIS — Z85820 Personal history of malignant melanoma of skin: Secondary | ICD-10-CM

## 2017-06-16 DIAGNOSIS — K56600 Partial intestinal obstruction, unspecified as to cause: Secondary | ICD-10-CM

## 2017-06-16 DIAGNOSIS — R1013 Epigastric pain: Secondary | ICD-10-CM | POA: Diagnosis not present

## 2017-06-16 DIAGNOSIS — Z8619 Personal history of other infectious and parasitic diseases: Secondary | ICD-10-CM

## 2017-06-16 HISTORY — DX: Malignant (primary) neoplasm, unspecified: C80.1

## 2017-06-16 LAB — URINALYSIS, ROUTINE W REFLEX MICROSCOPIC
Bilirubin Urine: NEGATIVE
Glucose, UA: NEGATIVE mg/dL
HGB URINE DIPSTICK: NEGATIVE
Ketones, ur: NEGATIVE mg/dL
Leukocytes, UA: NEGATIVE
Nitrite: NEGATIVE
PH: 7.5 (ref 5.0–8.0)
Protein, ur: NEGATIVE mg/dL
Specific Gravity, Urine: 1.01 (ref 1.005–1.030)

## 2017-06-16 LAB — COMPREHENSIVE METABOLIC PANEL
ALT: 46 U/L (ref 14–54)
AST: 32 U/L (ref 15–41)
Albumin: 3.8 g/dL (ref 3.5–5.0)
Alkaline Phosphatase: 86 U/L (ref 38–126)
Anion gap: 6 (ref 5–15)
BUN: 12 mg/dL (ref 6–20)
CHLORIDE: 108 mmol/L (ref 101–111)
CO2: 21 mmol/L — ABNORMAL LOW (ref 22–32)
CREATININE: 0.69 mg/dL (ref 0.44–1.00)
Calcium: 9.2 mg/dL (ref 8.9–10.3)
GFR calc Af Amer: >60 mL/min (ref >60)
Glucose, Bld: 153 mg/dL — ABNORMAL HIGH (ref 65–99)
Potassium: 4 mmol/L (ref 3.5–5.1)
Sodium: 135 mmol/L (ref 135–145)
Total Bilirubin: 0.6 mg/dL (ref 0.3–1.2)
Total Protein: 7.3 g/dL (ref 6.5–8.1)

## 2017-06-16 LAB — CBC WITH DIFFERENTIAL/PLATELET
Basophils Absolute: 0 10*3/uL (ref 0.0–0.1)
Basophils Relative: 0 %
EOS ABS: 0 10*3/uL (ref 0.0–0.7)
EOS PCT: 0 %
HCT: 45.5 % (ref 36.0–46.0)
Hemoglobin: 16 g/dL — ABNORMAL HIGH (ref 12.0–15.0)
LYMPHS ABS: 1.4 10*3/uL (ref 0.7–4.0)
Lymphocytes Relative: 9 %
MCH: 30.4 pg (ref 26.0–34.0)
MCHC: 35.2 g/dL (ref 30.0–36.0)
MCV: 86.3 fL (ref 78.0–100.0)
MONO ABS: 0.4 10*3/uL (ref 0.1–1.0)
Monocytes Relative: 3 %
NEUTROS ABS: 12.8 10*3/uL — AB (ref 1.7–7.7)
NEUTROS PCT: 88 %
Platelets: 233 10*3/uL (ref 150–400)
RBC: 5.27 MIL/uL — ABNORMAL HIGH (ref 3.87–5.11)
RDW: 12.4 % (ref 11.5–15.5)
WBC: 14.6 10*3/uL — ABNORMAL HIGH (ref 4.0–10.5)

## 2017-06-16 LAB — LIPASE, BLOOD: LIPASE: 38 U/L (ref 11–51)

## 2017-06-16 LAB — PREGNANCY, URINE: Preg Test, Ur: NEGATIVE

## 2017-06-16 MED ORDER — MORPHINE SULFATE (PF) 2 MG/ML IV SOLN
1.0000 mg | INTRAVENOUS | Status: DC | PRN
  Administered 2017-06-16: 2 mg via INTRAVENOUS
  Filled 2017-06-16: qty 1

## 2017-06-16 MED ORDER — ONDANSETRON HCL 4 MG/2ML IJ SOLN
4.0000 mg | Freq: Once | INTRAMUSCULAR | Status: AC
  Administered 2017-06-16: 4 mg via INTRAVENOUS
  Filled 2017-06-16: qty 2

## 2017-06-16 MED ORDER — IOPAMIDOL (ISOVUE-300) INJECTION 61%
100.0000 mL | Freq: Once | INTRAVENOUS | Status: AC | PRN
  Administered 2017-06-16: 100 mL via INTRAVENOUS

## 2017-06-16 MED ORDER — SUMATRIPTAN SUCCINATE 50 MG PO TABS
50.0000 mg | ORAL_TABLET | ORAL | Status: DC | PRN
  Filled 2017-06-16: qty 1

## 2017-06-16 MED ORDER — ONDANSETRON 4 MG PO TBDP: 4.0000 mg | ORAL_TABLET | Freq: Four times a day (QID) | ORAL | Status: DC | PRN

## 2017-06-16 MED ORDER — MORPHINE SULFATE (PF) 4 MG/ML IV SOLN
6.0000 mg | Freq: Once | INTRAVENOUS | Status: AC
  Administered 2017-06-16: 6 mg via INTRAVENOUS
  Filled 2017-06-16: qty 2

## 2017-06-16 MED ORDER — SODIUM CHLORIDE 0.9 % IV SOLN
Freq: Once | INTRAVENOUS | Status: AC
  Administered 2017-06-16: 09:00:00 via INTRAVENOUS

## 2017-06-16 MED ORDER — PROMETHAZINE HCL 25 MG/ML IJ SOLN
12.5000 mg | Freq: Once | INTRAMUSCULAR | Status: AC
  Administered 2017-06-16: 12.5 mg via INTRAVENOUS
  Filled 2017-06-16: qty 1

## 2017-06-16 MED ORDER — HEPARIN SODIUM (PORCINE) 5000 UNIT/ML IJ SOLN
5000.0000 [IU] | Freq: Three times a day (TID) | INTRAMUSCULAR | Status: DC
  Administered 2017-06-17 – 2017-06-19 (×7): 5000 [IU] via SUBCUTANEOUS
  Filled 2017-06-16 (×8): qty 1

## 2017-06-16 MED ORDER — ONDANSETRON HCL 4 MG/2ML IJ SOLN: 4.0000 mg | Freq: Four times a day (QID) | INTRAMUSCULAR | Status: DC | PRN

## 2017-06-16 MED ORDER — PANTOPRAZOLE SODIUM 40 MG IV SOLR
40.0000 mg | Freq: Every day | INTRAVENOUS | Status: DC
  Administered 2017-06-16 – 2017-06-18 (×3): 40 mg via INTRAVENOUS
  Filled 2017-06-16 (×3): qty 40

## 2017-06-16 MED ORDER — KCL IN DEXTROSE-NACL 20-5-0.9 MEQ/L-%-% IV SOLN
INTRAVENOUS | Status: DC
  Administered 2017-06-17 – 2017-06-18 (×3): via INTRAVENOUS
  Filled 2017-06-16 (×4): qty 1000

## 2017-06-16 NOTE — ED Triage Notes (Addendum)
Pt reports epigastric pain/cramping since last night, followed by emesis at 3am - reports 4 BM's this morning. Reports eating at a wrestling tournament yesterday. Reports green emesis, dark stools. Denies fever, or rash. Pain radiates to mid back.

## 2017-06-16 NOTE — ED Notes (Signed)
Pt on auto VS. C/o increased pain. RN aware.

## 2017-06-16 NOTE — ED Notes (Signed)
No respiratory or acute distress noted call light in reach alert and oriented x 3.

## 2017-06-16 NOTE — ED Notes (Signed)
Report received from [place].

## 2017-06-16 NOTE — H&P (Signed)
Raven Sharp is an 46 y.o. female.   Chief Complaint: abdominal pain HPI:  The patient is a 46 year old white female who presents with abdominal pain that started yesterday afternoon. She reported the pain is severe in nature. It would last about 10-20 seconds and then ease off. This continued through the evening. She went to the emergency department where she had several episodes of vomiting. A CT scan showed what is suggestive of a distal small bowel intussusception with early partial obstruction. She has not vomited or had the severe pain for several hours now.  Past Medical History:  Diagnosis Date  . Cancer ([place])   . Depression   . H pylori ulcer   . Melanoma in situ ([place])   . Migraine     History reviewed. No pertinent surgical history.  History reviewed. No pertinent family history. Social History:  reports that she has never smoked. She has never used smokeless tobacco. She reports that she drinks alcohol. She reports that she does not use drugs.  Allergies:  Allergies  Allergen Reactions  . Amitriptyline Hcl Other (See Comments)    Vertigo   . Minocycline Other (See Comments)    Vertigo      (Not in a hospital admission)  Results for orders placed or performed during the hospital encounter of 06/16/17 (from the past 48 hour(s))  CBC with Differential     Status: Abnormal   Collection Time: 06/16/17  9:15 AM  Result Value Ref Range   WBC 14.6 (H) 4.0 - 10.5 K/uL   RBC 5.27 (H) 3.87 - 5.11 MIL/uL   Hemoglobin 16.0 (H) 12.0 - 15.0 g/dL   HCT 45.5 36.0 - 46.0 %   MCV 86.3 78.0 - 100.0 fL   MCH 30.4 26.0 - 34.0 pg   MCHC 35.2 30.0 - 36.0 g/dL   RDW 12.4 11.5 - 15.5 %   Platelets 233 150 - 400 K/uL   Neutrophils Relative % 88 %   Neutro Abs 12.8 (H) 1.7 - 7.7 K/uL   Lymphocytes Relative 9 %   Lymphs Abs 1.4 0.7 - 4.0 K/uL   Monocytes Relative 3 %   Monocytes Absolute 0.4 0.1 - 1.0 K/uL   Eosinophils Relative 0 %   Eosinophils Absolute 0.0 0.0 - 0.7 K/uL    Basophils Relative 0 %   Basophils Absolute 0.0 0.0 - 0.1 K/uL  Comprehensive metabolic panel     Status: Abnormal   Collection Time: 06/16/17  9:15 AM  Result Value Ref Range   Sodium 135 135 - 145 mmol/L   Potassium 4.0 3.5 - 5.1 mmol/L   Chloride 108 101 - 111 mmol/L   CO2 21 (L) 22 - 32 mmol/L   Glucose, Bld 153 (H) 65 - 99 mg/dL   BUN 12 6 - 20 mg/dL   Creatinine, Ser 0.69 0.44 - 1.00 mg/dL   Calcium 9.2 8.9 - 10.3 mg/dL   Total Protein 7.3 6.5 - 8.1 g/dL   Albumin 3.8 3.5 - 5.0 g/dL   AST 32 15 - 41 U/L   ALT 46 14 - 54 U/L   Alkaline Phosphatase 86 38 - 126 U/L   Total Bilirubin 0.6 0.3 - 1.2 mg/dL   GFR calc non Af Amer >60 >60 mL/min   GFR calc Af Amer >60 >60 mL/min    Comment: (NOTE) The eGFR has been calculated using the CKD EPI equation. This calculation has not been validated in all clinical situations. eGFR's persistently <60 mL/min signify  possible Chronic Kidney Disease.    Anion gap 6 5 - 15  Lipase, blood     Status: None   Collection Time: 06/16/17  9:15 AM  Result Value Ref Range   Lipase 38 11 - 51 U/L  Urinalysis, Routine w reflex microscopic     Status: None   Collection Time: 06/16/17 10:13 AM  Result Value Ref Range   Color, Urine YELLOW YELLOW   APPearance CLEAR CLEAR   Specific Gravity, Urine 1.010 1.005 - 1.030   pH 7.5 5.0 - 8.0   Glucose, UA NEGATIVE NEGATIVE mg/dL   Hgb urine dipstick NEGATIVE NEGATIVE   Bilirubin Urine NEGATIVE NEGATIVE   Ketones, ur NEGATIVE NEGATIVE mg/dL   Protein, ur NEGATIVE NEGATIVE mg/dL   Nitrite NEGATIVE NEGATIVE   Leukocytes, UA NEGATIVE NEGATIVE    Comment: Microscopic not done on urines with negative protein, blood, leukocytes, nitrite, or glucose < 500 mg/dL.  Pregnancy, urine     Status: None   Collection Time: 06/16/17 10:13 AM  Result Value Ref Range   Preg Test, Ur NEGATIVE NEGATIVE    Comment:        THE SENSITIVITY OF THIS METHODOLOGY IS >20 mIU/mL.    Ct Abdomen Pelvis W Contrast  Result  Date: 06/16/2017 CLINICAL DATA:  Acute onset epigastric abdominal pain, some lower abdominal pain in the midline with associated nausea and vomiting. Elevated white blood cell count today. EXAM: CT ABDOMEN AND PELVIS WITH CONTRAST TECHNIQUE: Multidetector CT imaging of the abdomen and pelvis was performed using the standard protocol following bolus administration of intravenous contrast. CONTRAST:  163m ISOVUE-300 IOPAMIDOL (ISOVUE-300) INJECTION 61% COMPARISON:  CT abdomen dated 08/29/2013. FINDINGS: Lower chest: No acute abnormality. Hepatobiliary: No focal liver abnormality is seen. No gallstones, gallbladder wall thickening, or biliary dilatation. Pancreas: Unremarkable. No pancreatic ductal dilatation or surrounding inflammatory changes. Spleen: Normal in size without focal abnormality. Adrenals/Urinary Tract: Adrenal glands appear normal. Kidneys are unremarkable without mass, stone or hydronephrosis. No perinephric fluid. No ureteral or bladder calculi identified. Bladder is unremarkable. Stomach/Bowel: Thickening of the walls of the terminal ileum. Associated intussusception with moderate dilatation of several loops of fluid-filled ileum proximal to the TI. More proximal small bowel is normal in caliber. Stomach is nondistended. Fluid is seen throughout the decompressed colon. Appendix appears grossly normal. Vascular/Lymphatic: No significant vascular findings are present. No enlarged abdominal or pelvic lymph nodes. Reproductive: Uterus and bilateral adnexa are unremarkable. Other: Small amount of free fluid in the right lower quadrant, adjacent to the terminal ileum. No abscess collection seen. No free intraperitoneal air. Musculoskeletal: Stable scoliosis of the thoracolumbar spine. No acute or suspicious osseous finding. IMPRESSION: 1. Focal thickening of the walls of the terminal ileum suggesting infectious or inflammatory enteritis. As there were similar changes in this area on CT abdomen of  08/29/2013, I suspect recurrent Crohn's disease. 2. Associated intussusception at the terminal ileum, related to the adjacent bowel wall thickening/inflammation, resulting in partial small bowel obstruction with several loops of moderately dilated fluid-filled ileum proximal to the terminal ileum. The more proximal ileum and jejunum are nondistended. 3. Small amount of free fluid in the right lower quadrant. No abscess collection seen. No free intraperitoneal air. 4. Scoliosis. Electronically Signed   By: SFranki CabotM.D.   On: 06/16/2017 11:39    Review of Systems  Constitutional: Negative.   HENT: Negative.   Eyes: Negative.   Respiratory: Negative.   Cardiovascular: Negative.   Gastrointestinal: Positive for abdominal pain, nausea and  vomiting.  Genitourinary: Negative.   Musculoskeletal: Negative.   Skin: Negative.   Neurological: Negative.   Endo/Heme/Allergies: Negative.   Psychiatric/Behavioral: Negative.     Blood pressure 116/79, pulse 78, temperature 98.9 F (37.2 C), temperature source Oral, resp. rate 13, height '5\' 2"'$  (1.575 m), weight 68 kg (150 lb), last menstrual period 06/02/2017, SpO2 99 %. Physical Exam  Constitutional: She is oriented to person, place, and time. She appears well-developed and well-nourished. No distress.  HENT:  Head: Normocephalic and atraumatic.  Mouth/Throat: No oropharyngeal exudate.  Eyes: Pupils are equal, round, and reactive to light. Conjunctivae and EOM are normal.  Neck: Normal range of motion. Neck supple.  Cardiovascular: Normal rate, regular rhythm and normal heart sounds.   Respiratory: Effort normal and breath sounds normal. No stridor. No respiratory distress.  GI: Soft. Bowel sounds are normal. She exhibits no distension.  There is minimal tenderness  Musculoskeletal: Normal range of motion. She exhibits no edema or tenderness.  Neurological: She is alert and oriented to person, place, and time. Coordination normal.  Skin: Skin  is warm and dry. No rash noted.  Psychiatric: She has a normal mood and affect. Her behavior is normal. Thought content normal.     Assessment/Plan The patient appears to have a intussusception with partial small bowel obstruction at the terminal ileal area. Her symptoms seem to have abated at this point. I would recommend admission for observation and bowel rest and IV hydration. If she continues to improve it may be worth having her follow-up with her gastroenterologist for follow-up colonoscopy in the near future. If she does not improve that she may require surgical intervention. She is in agreement with this plan.  Merrie Roof, MD 06/16/2017, 4:35 PM

## 2017-06-16 NOTE — ED Provider Notes (Signed)
MSE was initiated and I personally evaluated the patient and placed orders (if any) at  2:15 PM on June 16, 2017.  The patient appears stable so that the remainder of the MSE may be completed by another provider.  Briefly, fatient is a 46 year old female who presented to [place] by Jeannett Senior, PA-C for epigastric abdominal pain and cramping since overnight last night, and loose stools.  Per CT report, patient showing possible limited colitis and associated intussusception of terminal ileum related to reduce bowel wall thickening. Patient was transferred for evaluation by Dr. Marlou Starks and general surgery.  On evaluation, patient is afebrile, well-appearing, and in no acute distress.  Patient's pain is adequately controlled at this time. Patient awaiting general surgery eval.  Physical Exam  Constitutional: She appears well-developed and well-nourished. No distress.  Sitting comfortably in bed.  HENT:  Head: Normocephalic and atraumatic.  Eyes: Conjunctivae are normal. Right eye exhibits no discharge. Left eye exhibits no discharge.  EOMs normal to gross examination.  Neck: Normal range of motion.  Cardiovascular: Normal rate and regular rhythm.   Intact, 2+ radial pulse.  Pulmonary/Chest:  Normal respiratory effort. Patient converses comfortably. No audible wheeze or stridor.  Abdominal: She exhibits no distension.  Musculoskeletal: Normal range of motion.  Neurological: She is alert.  Cranial nerves intact to gross observation. Patient moves extremities without difficulty.  Skin: Skin is warm and dry. She is not diaphoretic.  Psychiatric: She has a normal mood and affect. Her behavior is normal. Judgment and thought content normal.  Nursing note and vitals reviewed.  Per note by Dr. Marlou Starks, patient will be admitted for observation.        Albesa Seen, PA-C 06/16/17 1718    Isla Pence, MD 06/18/17 [phone]

## 2017-06-16 NOTE — ED Notes (Signed)
No respiratory or acute distress noted alert and oriented x 3 call light in reach no reaction to medication noted. 

## 2017-06-16 NOTE — ED Provider Notes (Signed)
[facility] EMERGENCY DEPARTMENT Provider Note   CSN: [id number] Arrival date & time: 06/16/17  8315     History   Chief Complaint Chief Complaint  Patient presents with  . Abdominal Pain    HPI Raven Sharp is a 46 y.o. female.  HPI Raven Sharp is a 46 y.o. female presents to ED with complaint of abdominal pain. Pt states pain started last night. Pt reports associated with nausea and vomiting. Reports two loose stools today.  States pain is crampy, all over, worse in the upper abdomen.  States her emesis is bilious.  Denies any fever or chills.  Denies any urinary symptoms.  Denies any vaginal discharge or bleeding.  Denies any blood in her stool or emesis.  She states she has had bowel obstruction in the past and feels similar to the.  Denies any prior abdominal surgeries.  No treatment prior to coming in.  No one around her sick with the same.  Past Medical History:  Diagnosis Date  . Depression   . H pylori ulcer   . Melanoma in situ ([place])   . Migraine     Patient Active Problem List   Diagnosis Date Noted  . ACUTE BRONCHITIS 09/29/2010  . DIZZINESS 07/13/2008  . DEPRESSION 06/03/2008  . COMMON MIGRAINE 06/03/2008  . HAIR LOSS 06/03/2008  . FATIGUE 06/03/2008    History reviewed. No pertinent surgical history.  OB History    No data available       Home Medications    Prior to Admission medications   Medication Sig Start Date End Date Taking? Authorizing Provider  cetirizine (ZYRTEC) 10 MG tablet Take 10 mg by mouth daily.   Yes [provider]  cholecalciferol (VITAMIN D) 1000 units tablet Take 1,000 Units by mouth daily.   Yes [provider]  desvenlafaxine (PRISTIQ) 50 MG 24 hr tablet Take 50 mg by mouth daily.    [provider]  pantoprazole (PROTONIX) 40 MG tablet Take 40 mg by mouth daily.    [provider]  rOPINIRole (REQUIP) 2 MG tablet Take 2 mg by mouth at bedtime.    [provider]   spironolactone (ALDACTONE) 50 MG tablet Take 50 mg by mouth daily.    [provider]  zonisamide (ZONEGRAN) 100 MG capsule Take 200 mg by mouth daily.    [provider]    Family History History reviewed. No pertinent family history.  Social History Social History  Substance Use Topics  . Smoking status: Never Smoker  . Smokeless tobacco: Never Used  . Alcohol use Yes     Comment: ocassionally     Allergies   Amitriptyline hcl and Minocycline   Review of Systems Review of Systems  Constitutional: Negative for chills and fever.  Respiratory: Negative for cough, chest tightness and shortness of breath.   Cardiovascular: Negative for chest pain, palpitations and leg swelling.  Gastrointestinal: Positive for abdominal pain, diarrhea, nausea and vomiting. Negative for blood in stool.  Genitourinary: Negative for dysuria, flank pain, pelvic pain, vaginal bleeding, vaginal discharge and vaginal pain.  Musculoskeletal: Negative for arthralgias, myalgias, neck pain and neck stiffness.  Skin: Negative for rash.  Neurological: Negative for dizziness, weakness and headaches.  All other systems reviewed and are negative.    Physical Exam Updated Vital Signs BP (!) 139/98 (BP Location: Right Arm)   Pulse 96   Temp 98.5 F (36.9 C) (Oral)   Resp 16   Ht 5\' 2"  (  1.575 m)   Wt 68 kg (150 lb)   LMP 06/02/2017   SpO2 99%   BMI 27.44 kg/m   Physical Exam  Constitutional: She is oriented to person, place, and time. She appears well-developed and well-nourished.  Patient appears to be uncomfortable  HENT:  Head: Normocephalic.  Eyes: Conjunctivae are normal.  Neck: Neck supple.  Cardiovascular: Normal rate, regular rhythm and normal heart sounds.   Pulmonary/Chest: Effort normal and breath sounds normal. No respiratory distress. She has no wheezes. She has no rales.  Abdominal: Soft. She exhibits no distension. There is tenderness. There is no rebound.    Hyperactive bowel sounds.  Diffuse tenderness  Musculoskeletal: She exhibits no edema.  Neurological: She is alert and oriented to person, place, and time.  Skin: Skin is warm and dry.  Psychiatric: She has a normal mood and affect. Her behavior is normal.  Nursing note and vitals reviewed.    ED Treatments / Results  Labs (all labs ordered are listed, but only abnormal results are displayed) Labs Reviewed  CBC WITH DIFFERENTIAL/PLATELET - Abnormal; Notable for the following:       Result Value   WBC 14.6 (*)    RBC 5.27 (*)    Hemoglobin 16.0 (*)    Neutro Abs 12.8 (*)    All other components within normal limits  COMPREHENSIVE METABOLIC PANEL - Abnormal; Notable for the following:    CO2 21 (*)    Glucose, Bld 153 (*)    All other components within normal limits  LIPASE, BLOOD  URINALYSIS, ROUTINE W REFLEX MICROSCOPIC  PREGNANCY, URINE    EKG  EKG Interpretation None       Radiology Ct Abdomen Pelvis W Contrast  Result Date: 06/16/2017 CLINICAL DATA:  Acute onset epigastric abdominal pain, some lower abdominal pain in the midline with associated nausea and vomiting. Elevated white blood cell count today. EXAM: CT ABDOMEN AND PELVIS WITH CONTRAST TECHNIQUE: Multidetector CT imaging of the abdomen and pelvis was performed using the standard protocol following bolus administration of intravenous contrast. CONTRAST:  129mL ISOVUE-300 IOPAMIDOL (ISOVUE-300) INJECTION 61% COMPARISON:  CT abdomen dated 08/29/2013. FINDINGS: Lower chest: No acute abnormality. Hepatobiliary: No focal liver abnormality is seen. No gallstones, gallbladder wall thickening, or biliary dilatation. Pancreas: Unremarkable. No pancreatic ductal dilatation or surrounding inflammatory changes. Spleen: Normal in size without focal abnormality. Adrenals/Urinary Tract: Adrenal glands appear normal. Kidneys are unremarkable without mass, stone or hydronephrosis. No perinephric fluid. No ureteral or bladder  calculi identified. Bladder is unremarkable. Stomach/Bowel: Thickening of the walls of the terminal ileum. Associated intussusception with moderate dilatation of several loops of fluid-filled ileum proximal to the TI. More proximal small bowel is normal in caliber. Stomach is nondistended. Fluid is seen throughout the decompressed colon. Appendix appears grossly normal. Vascular/Lymphatic: No significant vascular findings are present. No enlarged abdominal or pelvic lymph nodes. Reproductive: Uterus and bilateral adnexa are unremarkable. Other: Small amount of free fluid in the right lower quadrant, adjacent to the terminal ileum. No abscess collection seen. No free intraperitoneal air. Musculoskeletal: Stable scoliosis of the thoracolumbar spine. No acute or suspicious osseous finding. IMPRESSION: 1. Focal thickening of the walls of the terminal ileum suggesting infectious or inflammatory enteritis. As there were similar changes in this area on CT abdomen of 08/29/2013, I suspect recurrent Crohn's disease. 2. Associated intussusception at the terminal ileum, related to the adjacent bowel wall thickening/inflammation, resulting in partial small bowel obstruction with several loops of moderately dilated fluid-filled ileum proximal to  the terminal ileum. The more proximal ileum and jejunum are nondistended. 3. Small amount of free fluid in the right lower quadrant. No abscess collection seen. No free intraperitoneal air. 4. Scoliosis. Electronically Signed   By: Franki Cabot M.D.   On: 06/16/2017 11:39    Procedures Procedures (including critical care time)  Medications Ordered in ED Medications  0.9 %  sodium chloride infusion ( Intravenous New Bag/Given 06/16/17 0927)  ondansetron (ZOFRAN) injection 4 mg (4 mg Intravenous Given 06/16/17 0926)     Initial Impression / Assessment and Plan / ED Course  I have reviewed the triage vital signs and the nursing notes.  Pertinent labs & imaging results that  were available during my care of the patient were reviewed by me and considered in my medical decision making (see chart for details).     Patient with abdominal pain, nausea, vomiting, loose stools.  Patient appears to be uncomfortable, diffuse abdominal tenderness worse in the upper abdomen.  History of partial small bowel obstruction.  No prior surgeries.  Will get labs, CT abdomen pelvis for further evaluation.   CT findings as above.  Discussed these findings with patient.  She states after her last episode of small bowel obstruction she had an endoscopy and colonoscopy which confirmed that she had H. pylori, otherwise negative studies.  She does endorse family history of ulcerative colitis in her knees and states that her sister passed away from colon cancer.  At this time patient is nontoxic-appearing, afebrile, will hold off on administering antibiotics.  Will discuss with general surgery given a small bowel obstruction and intussusception.  12:15 PM Discussed Dr. Marlou Starks, general surgery, would like to examine pt in ED. Spoke with Dr. Zenia Resides in [facility] ED, accepts for transfer.      Final Clinical Impressions(s) / ED Diagnoses   Final diagnoses:  Partial small bowel obstruction ([place])  Intussusception [facility])    New Prescriptions New Prescriptions   No medications on file     Jeannett Senior, Hershal Coria 06/18/17 [place], [place], MD 06/19/17 [id number]

## 2017-06-17 ENCOUNTER — Encounter (HOSPITAL_COMMUNITY): Payer: Self-pay | Admitting: Gastroenterology

## 2017-06-17 ENCOUNTER — Observation Stay (HOSPITAL_COMMUNITY): Payer: BLUE CROSS/BLUE SHIELD

## 2017-06-17 DIAGNOSIS — Z800 Family history of malignant neoplasm of digestive organs: Secondary | ICD-10-CM | POA: Diagnosis not present

## 2017-06-17 DIAGNOSIS — Z8619 Personal history of other infectious and parasitic diseases: Secondary | ICD-10-CM | POA: Diagnosis not present

## 2017-06-17 DIAGNOSIS — K561 Intussusception: Secondary | ICD-10-CM | POA: Diagnosis present

## 2017-06-17 DIAGNOSIS — Z85820 Personal history of malignant melanoma of skin: Secondary | ICD-10-CM | POA: Diagnosis not present

## 2017-06-17 DIAGNOSIS — R1013 Epigastric pain: Secondary | ICD-10-CM | POA: Diagnosis present

## 2017-06-17 LAB — CBC
HCT: 37.9 % (ref 36.0–46.0)
Hemoglobin: 13 g/dL (ref 12.0–15.0)
MCH: 30.4 pg (ref 26.0–34.0)
MCHC: 34.3 g/dL (ref 30.0–36.0)
MCV: 88.8 fL (ref 78.0–100.0)
PLATELETS: 155 10*3/uL (ref 150–400)
RBC: 4.27 MIL/uL (ref 3.87–5.11)
RDW: 12.9 % (ref 11.5–15.5)
WBC: 7.1 10*3/uL (ref 4.0–10.5)

## 2017-06-17 LAB — BASIC METABOLIC PANEL
Anion gap: 6 (ref 5–15)
BUN: 9 mg/dL (ref 6–20)
CHLORIDE: 113 mmol/L — AB (ref 101–111)
CO2: 23 mmol/L (ref 22–32)
CREATININE: 0.8 mg/dL (ref 0.44–1.00)
Calcium: 8.1 mg/dL — ABNORMAL LOW (ref 8.9–10.3)
GFR calc non Af Amer: >60 mL/min (ref >60)
Glucose, Bld: 102 mg/dL — ABNORMAL HIGH (ref 65–99)
Potassium: 3.5 mmol/L (ref 3.5–5.1)
SODIUM: 142 mmol/L (ref 135–145)

## 2017-06-17 LAB — HIV ANTIBODY (ROUTINE TESTING W REFLEX): HIV Screen 4th Generation wRfx: NONREACTIVE

## 2017-06-17 MED ORDER — PIPERACILLIN-TAZOBACTAM 3.375 G IVPB
3.3750 g | Freq: Three times a day (TID) | INTRAVENOUS | Status: DC
  Administered 2017-06-17 – 2017-06-19 (×5): 3.375 g via INTRAVENOUS
  Filled 2017-06-17 (×4): qty 50

## 2017-06-17 NOTE — Progress Notes (Signed)
Pharmacy Antibiotic Note  Raven Sharp is a 46 y.o. female admitted on 06/16/2017 with intra-abdominal infection.  Pharmacy has been consulted for zosyn dosing.  Plan:  Zosyn 3.375gm IV q8h over 4h infusion  Do not anticipate need for dose adjustment, will sign-off   Height: 5\' 2"  (157.5 cm) Weight: 150 lb (68 kg) IBW/kg (Calculated) : 50.1  Temp (24hrs), Avg:98.8 F (37.1 C), Min:98.3 F (36.8 C), Max:99.1 F (37.3 C)   Recent Labs Lab 06/16/17 0915 06/17/17 0547  WBC 14.6* 7.1  CREATININE 0.69 0.80    Estimated Creatinine Clearance: 80.3 mL/min (by C-G formula based on SCr of 0.8 mg/dL).    Allergies  Allergen Reactions  . Amitriptyline Hcl Other (See Comments)    Vertigo   . Minocycline Other (See Comments)    Vertigo     Antimicrobials this admission: 10/29 >> zosyn >>  Dose adjustments this admission:  Microbiology results: none  Thank you for allowing pharmacy to be a part of this patient's care.  Doreene Eland, PharmD, BCPS.   Pager: [phone] 06/17/2017 4:45 PM

## 2017-06-17 NOTE — Care Management Note (Signed)
Case Management Note  Patient Details  Name: Raven Sharp MRN: [id number] Date of Birth: 1971-08-06  Subjective/Objective:47 y/o f admitted wSBO. From home.                    Action/Plan:d/c plan home.   Expected Discharge Date:                  Expected Discharge Plan:  Home/Self Care  In-House Referral:     Discharge planning Services  CM Consult  Post Acute Care Choice:    Choice offered to:     DME Arranged:    DME Agency:     HH Arranged:    HH Agency:     Status of Service:  In process, will continue to follow  If discussed at Long Length of Stay Meetings, dates discussed:    Additional Comments:  Dessa Phi, RN 06/17/2017, 1:16 PM

## 2017-06-17 NOTE — Progress Notes (Signed)
[facility]/Trauma Progress Note      Assessment/Plan Hx of melanoma  Hx of H pylori Hx of migraines  SBO - inflammation seen at terminal ileum, suspected recurrent Crohn's based on CT from 2015, associated intussusception, partial SBO - abd xray today (10/29) showed nonspecific bowel gas pattern  FEN: clears VTE: SCD's, heparin ID: none currently Foley: none Follow up: TBD  DISPO: clears, await flatus to advance diet, GI consult for Crohn's workup    LOS: 0 days    Subjective: CC: abdominal pain  Pt states pain greatly improved. Intermittent mild spells of pain that quickly resolve. No nausea or vomiting. No flatus or BM. Pt states similar SBO episode 4 years ago and inflammation seen at terminal ileum on CT. Colonoscopy shortly after episode showed ulcerations at the ileocecal valve. Niece has UC. Sister died before 52 of colon cancer.   Objective: Vital signs in last 24 hours: Temp:  [98.3 F (36.8 C)-99.1 F (37.3 C)] 99.1 F (37.3 C) (10/29 0545) Pulse Rate:  [73-89] 73 (10/29 0545) Resp:  [13-18] 16 (10/29 0545) BP: (97-136)/(64-89) 105/64 (10/29 0545) SpO2:  [95 %-100 %] 99 % (10/29 0545) Last BM Date: 06/16/17  Intake/Output from previous day: 10/28 0701 - 10/29 0700 In: 501.7 [I.V.:501.7] Out: 0  Intake/Output this shift: No intake/output data recorded.  PE: Gen:  Alert, NAD, pleasant, cooperative Card:  RRR, no M/G/R heard Pulm:  CTA, no W/R/R, effort normal Abd: Soft, not distended, +BS, no HSM, mild TTP in RLQ, no guarding Skin: no rashes noted, warm and dry   Anti-infectives: Anti-infectives    None      Lab Results:   Recent Labs  06/16/17 0915 06/17/17 0547  WBC 14.6* 7.1  HGB 16.0* 13.0  HCT 45.5 37.9  PLT 233 155   BMET  Recent Labs  06/16/17 0915 06/17/17 0547  NA 135 142  K 4.0 3.5  CL 108 113*  CO2 21* 23  GLUCOSE 153* 102*  BUN 12 9  CREATININE 0.69 0.80  CALCIUM 9.2 8.1*   PT/INR No results  for input(s): LABPROT, INR in the last 72 hours. CMP     Component Value Date/Time   NA 142 06/17/2017 0547   K 3.5 06/17/2017 0547   CL 113 (H) 06/17/2017 0547   CO2 23 06/17/2017 0547   GLUCOSE 102 (H) 06/17/2017 0547   BUN 9 06/17/2017 0547   CREATININE 0.80 06/17/2017 0547   CALCIUM 8.1 (L) 06/17/2017 0547   PROT 7.3 06/16/2017 0915   ALBUMIN 3.8 06/16/2017 0915   AST 32 06/16/2017 0915   ALT 46 06/16/2017 0915   ALKPHOS 86 06/16/2017 0915   BILITOT 0.6 06/16/2017 0915   GFRNONAA >60 06/17/2017 0547   GFRAA >60 06/17/2017 0547   Lipase     Component Value Date/Time   LIPASE 38 06/16/2017 0915    Studies/Results: Dg Abd 1 View  Result Date: 06/17/2017 CLINICAL DATA:  Small bowel obstruction. EXAM: ABDOMEN - 1 VIEW COMPARISON:  CT abdomen and pelvis June 16, 2017 FINDINGS: Bowel gas pattern is nondilated and nonobstructive with paucity of small bowel gas. A few loops of featureless air-filled small bowel in pelvis. No intra-abdominal mass effect or pathologic calcifications. Levoscoliosis. Lower quadrant. IMPRESSION: Nonspecific bowel gas pattern. Paucity of small bowel gas (fluid-filled small bowel obstruction not excluded). Electronically Signed   By: Elon Alas M.D.   On: 06/17/2017 05:51   Ct Abdomen Pelvis W Contrast  Result Date: 06/16/2017 CLINICAL DATA:  Acute onset epigastric abdominal pain, some lower abdominal pain in the midline with associated nausea and vomiting. Elevated white blood cell count today. EXAM: CT ABDOMEN AND PELVIS WITH CONTRAST TECHNIQUE: Multidetector CT imaging of the abdomen and pelvis was performed using the standard protocol following bolus administration of intravenous contrast. CONTRAST:  115mL ISOVUE-300 IOPAMIDOL (ISOVUE-300) INJECTION 61% COMPARISON:  CT abdomen dated 08/29/2013. FINDINGS: Lower chest: No acute abnormality. Hepatobiliary: No focal liver abnormality is seen. No gallstones, gallbladder wall thickening, or biliary  dilatation. Pancreas: Unremarkable. No pancreatic ductal dilatation or surrounding inflammatory changes. Spleen: Normal in size without focal abnormality. Adrenals/Urinary Tract: Adrenal glands appear normal. Kidneys are unremarkable without mass, stone or hydronephrosis. No perinephric fluid. No ureteral or bladder calculi identified. Bladder is unremarkable. Stomach/Bowel: Thickening of the walls of the terminal ileum. Associated intussusception with moderate dilatation of several loops of fluid-filled ileum proximal to the TI. More proximal small bowel is normal in caliber. Stomach is nondistended. Fluid is seen throughout the decompressed colon. Appendix appears grossly normal. Vascular/Lymphatic: No significant vascular findings are present. No enlarged abdominal or pelvic lymph nodes. Reproductive: Uterus and bilateral adnexa are unremarkable. Other: Small amount of free fluid in the right lower quadrant, adjacent to the terminal ileum. No abscess collection seen. No free intraperitoneal air. Musculoskeletal: Stable scoliosis of the thoracolumbar spine. No acute or suspicious osseous finding. IMPRESSION: 1. Focal thickening of the walls of the terminal ileum suggesting infectious or inflammatory enteritis. As there were similar changes in this area on CT abdomen of 08/29/2013, I suspect recurrent Crohn's disease. 2. Associated intussusception at the terminal ileum, related to the adjacent bowel wall thickening/inflammation, resulting in partial small bowel obstruction with several loops of moderately dilated fluid-filled ileum proximal to the terminal ileum. The more proximal ileum and jejunum are nondistended. 3. Small amount of free fluid in the right lower quadrant. No abscess collection seen. No free intraperitoneal air. 4. Scoliosis. Electronically Signed   By: Franki Cabot M.D.   On: 06/16/2017 11:39      Kalman Drape , [facility] 06/17/2017, 9:11 AM Pager:  [phone] Consults: [phone] Mon-Fri 7:00 am-4:30 pm Sat-Sun 7:00 am-11:30 am

## 2017-06-17 NOTE — Consult Note (Signed)
Reason for Consult: Abdominal pain abnormal x-ray Referring Physician: Surgical team  Raven Sharp is an 46 y.o. female.  HPI: Patient seen and examined and her hospital computer chart reviewed and her case discussed with the surgical team and the patient has been asymptomatic for 2 years until she had acute onset Saturday night after working out at a wrestling tournament and eating fried chicken and french fries and she was under some stress and she had pain and nausea vomiting and when he continued she presented to the ER on Sunday but she hasn't vomited since she got to the hospital and she is feeling better and tolerated clear liquids and although she has not passed any gas are gone to the bathroom she feels like she will soon and she's had 3 previous colonoscopies and one was in the hospital four years ago at [facility] in [place] and they did not mention anything about Crohn's or TI inflammation and she's not aware of ever having Crohn's blood tests and she denies any fever or chills night sweats weight loss bowel habit changes eye or skin or joint complaints but one sister had colon cancer and her niece has colitis but she does not think her sister did and she has no other complaints  Past Medical History:  Diagnosis Date  . Cancer (HCC)   . Depression   . H pylori ulcer   . Melanoma in situ (HCC)   . Migraine     History reviewed. No pertinent surgical history.  History reviewed. No pertinent family history.  Social History:  reports that she has never smoked. She has never used smokeless tobacco. She reports that she drinks alcohol. She reports that she does not use drugs.  Allergies:  Allergies  Allergen Reactions  . Amitriptyline Hcl Other (See Comments)    Vertigo   . Minocycline Other (See Comments)    Vertigo     Medications: I have reviewed the patient's current medications.  Results for orders placed or performed during the hospital encounter of 06/16/17 (from the  past 48 hour(s))  CBC with Differential     Status: Abnormal   Collection Time: 06/16/17  9:15 AM  Result Value Ref Range   WBC 14.6 (H) 4.0 - 10.5 K/uL   RBC 5.27 (H) 3.87 - 5.11 MIL/uL   Hemoglobin 16.0 (H) 12.0 - 15.0 g/dL   HCT 30.8 35.7 - 75.8 %   MCV 86.3 78.0 - 100.0 fL   MCH 30.4 26.0 - 34.0 pg   MCHC 35.2 30.0 - 36.0 g/dL   RDW 45.3 90.5 - 92.7 %   Platelets 233 150 - 400 K/uL   Neutrophils Relative % 88 %   Neutro Abs 12.8 (H) 1.7 - 7.7 K/uL   Lymphocytes Relative 9 %   Lymphs Abs 1.4 0.7 - 4.0 K/uL   Monocytes Relative 3 %   Monocytes Absolute 0.4 0.1 - 1.0 K/uL   Eosinophils Relative 0 %   Eosinophils Absolute 0.0 0.0 - 0.7 K/uL   Basophils Relative 0 %   Basophils Absolute 0.0 0.0 - 0.1 K/uL  Comprehensive metabolic panel     Status: Abnormal   Collection Time: 06/16/17  9:15 AM  Result Value Ref Range   Sodium 135 135 - 145 mmol/L   Potassium 4.0 3.5 - 5.1 mmol/L   Chloride 108 101 - 111 mmol/L   CO2 21 (L) 22 - 32 mmol/L   Glucose, Bld 153 (H) 65 - 99 mg/dL   BUN  12 6 - 20 mg/dL   Creatinine, Ser 0.69 0.44 - 1.00 mg/dL   Calcium 9.2 8.9 - 10.3 mg/dL   Total Protein 7.3 6.5 - 8.1 g/dL   Albumin 3.8 3.5 - 5.0 g/dL   AST 32 15 - 41 U/L   ALT 46 14 - 54 U/L   Alkaline Phosphatase 86 38 - 126 U/L   Total Bilirubin 0.6 0.3 - 1.2 mg/dL   GFR calc non Af Amer >60 >60 mL/min   GFR calc Af Amer >60 >60 mL/min    Comment: (NOTE) The eGFR has been calculated using the CKD EPI equation. This calculation has not been validated in all clinical situations. eGFR's persistently <60 mL/min signify possible Chronic Kidney Disease.    Anion gap 6 5 - 15  Lipase, blood     Status: None   Collection Time: 06/16/17  9:15 AM  Result Value Ref Range   Lipase 38 11 - 51 U/L  Urinalysis, Routine w reflex microscopic     Status: None   Collection Time: 06/16/17 10:13 AM  Result Value Ref Range   Color, Urine YELLOW YELLOW   APPearance CLEAR CLEAR   Specific Gravity, Urine  1.010 1.005 - 1.030   pH 7.5 5.0 - 8.0   Glucose, UA NEGATIVE NEGATIVE mg/dL   Hgb urine dipstick NEGATIVE NEGATIVE   Bilirubin Urine NEGATIVE NEGATIVE   Ketones, ur NEGATIVE NEGATIVE mg/dL   Protein, ur NEGATIVE NEGATIVE mg/dL   Nitrite NEGATIVE NEGATIVE   Leukocytes, UA NEGATIVE NEGATIVE    Comment: Microscopic not done on urines with negative protein, blood, leukocytes, nitrite, or glucose < 500 mg/dL.  Pregnancy, urine     Status: None   Collection Time: 06/16/17 10:13 AM  Result Value Ref Range   Preg Test, Ur NEGATIVE NEGATIVE    Comment:        THE SENSITIVITY OF THIS METHODOLOGY IS >20 mIU/mL.   Basic metabolic panel     Status: Abnormal   Collection Time: 06/17/17  5:47 AM  Result Value Ref Range   Sodium 142 135 - 145 mmol/L   Potassium 3.5 3.5 - 5.1 mmol/L   Chloride 113 (H) 101 - 111 mmol/L   CO2 23 22 - 32 mmol/L   Glucose, Bld 102 (H) 65 - 99 mg/dL   BUN 9 6 - 20 mg/dL   Creatinine, Ser 0.80 0.44 - 1.00 mg/dL   Calcium 8.1 (L) 8.9 - 10.3 mg/dL   GFR calc non Af Amer >60 >60 mL/min   GFR calc Af Amer >60 >60 mL/min    Comment: (NOTE) The eGFR has been calculated using the CKD EPI equation. This calculation has not been validated in all clinical situations. eGFR's persistently <60 mL/min signify possible Chronic Kidney Disease.    Anion gap 6 5 - 15  CBC     Status: None   Collection Time: 06/17/17  5:47 AM  Result Value Ref Range   WBC 7.1 4.0 - 10.5 K/uL   RBC 4.27 3.87 - 5.11 MIL/uL   Hemoglobin 13.0 12.0 - 15.0 g/dL   HCT 37.9 36.0 - 46.0 %   MCV 88.8 78.0 - 100.0 fL   MCH 30.4 26.0 - 34.0 pg   MCHC 34.3 30.0 - 36.0 g/dL   RDW 12.9 11.5 - 15.5 %   Platelets 155 150 - 400 K/uL    Dg Abd 1 View  Result Date: 06/17/2017 CLINICAL DATA:  Small bowel obstruction. EXAM: ABDOMEN - 1 VIEW COMPARISON:  CT abdomen and pelvis June 16, 2017 FINDINGS: Bowel gas pattern is nondilated and nonobstructive with paucity of small bowel gas. A few loops of  featureless air-filled small bowel in pelvis. No intra-abdominal mass effect or pathologic calcifications. Levoscoliosis. Lower quadrant. IMPRESSION: Nonspecific bowel gas pattern. Paucity of small bowel gas (fluid-filled small bowel obstruction not excluded). Electronically Signed   By: Elon Alas M.D.   On: 06/17/2017 05:51   Ct Abdomen Pelvis W Contrast  Result Date: 06/16/2017 CLINICAL DATA:  Acute onset epigastric abdominal pain, some lower abdominal pain in the midline with associated nausea and vomiting. Elevated white blood cell count today. EXAM: CT ABDOMEN AND PELVIS WITH CONTRAST TECHNIQUE: Multidetector CT imaging of the abdomen and pelvis was performed using the standard protocol following bolus administration of intravenous contrast. CONTRAST:  177m ISOVUE-300 IOPAMIDOL (ISOVUE-300) INJECTION 61% COMPARISON:  CT abdomen dated 08/29/2013. FINDINGS: Lower chest: No acute abnormality. Hepatobiliary: No focal liver abnormality is seen. No gallstones, gallbladder wall thickening, or biliary dilatation. Pancreas: Unremarkable. No pancreatic ductal dilatation or surrounding inflammatory changes. Spleen: Normal in size without focal abnormality. Adrenals/Urinary Tract: Adrenal glands appear normal. Kidneys are unremarkable without mass, stone or hydronephrosis. No perinephric fluid. No ureteral or bladder calculi identified. Bladder is unremarkable. Stomach/Bowel: Thickening of the walls of the terminal ileum. Associated intussusception with moderate dilatation of several loops of fluid-filled ileum proximal to the TI. More proximal small bowel is normal in caliber. Stomach is nondistended. Fluid is seen throughout the decompressed colon. Appendix appears grossly normal. Vascular/Lymphatic: No significant vascular findings are present. No enlarged abdominal or pelvic lymph nodes. Reproductive: Uterus and bilateral adnexa are unremarkable. Other: Small amount of free fluid in the right lower  quadrant, adjacent to the terminal ileum. No abscess collection seen. No free intraperitoneal air. Musculoskeletal: Stable scoliosis of the thoracolumbar spine. No acute or suspicious osseous finding. IMPRESSION: 1. Focal thickening of the walls of the terminal ileum suggesting infectious or inflammatory enteritis. As there were similar changes in this area on CT abdomen of 08/29/2013, I suspect recurrent Crohn's disease. 2. Associated intussusception at the terminal ileum, related to the adjacent bowel wall thickening/inflammation, resulting in partial small bowel obstruction with several loops of moderately dilated fluid-filled ileum proximal to the terminal ileum. The more proximal ileum and jejunum are nondistended. 3. Small amount of free fluid in the right lower quadrant. No abscess collection seen. No free intraperitoneal air. 4. Scoliosis. Electronically Signed   By: SFranki CabotM.D.   On: 06/16/2017 11:39    ROS negative except above Blood pressure 105/64, pulse 73, temperature 99.1 F (37.3 C), temperature source Oral, resp. rate 16, height '5\' 2"'$  (1.575 m), weight 68 kg (150 lb), last menstrual period 06/02/2017, SpO2 99 %. Physical Exam  Assessment/Plan: Vital signs stable afebrile no acute distress exam pertinent for her abdomen being soft nontender no guarding or rebound minimal soreness only labs and CT reviewed Assessment: Abnormal CT scan doubtful acute Crohn's currently improving Plan: Hold steroids for now if doing better tomorrow can advance diet and hopefully home soon to either follow up with me next week and probably proceed with outpatient repeat colonoscopy or [place] and we could consider the significant lab panel for Crohn's disease although it is not covered on some people's insurance and she will check with her primary GI doctor to see if that was ever done  [facility] 06/17/2017,  2:30 PM

## 2017-06-18 LAB — CBC
HCT: 37.5 % (ref 36.0–46.0)
HEMOGLOBIN: 13.2 g/dL (ref 12.0–15.0)
MCH: 30.9 pg (ref 26.0–34.0)
MCHC: 35.2 g/dL (ref 30.0–36.0)
MCV: 87.8 fL (ref 78.0–100.0)
PLATELETS: 154 10*3/uL (ref 150–400)
RBC: 4.27 MIL/uL (ref 3.87–5.11)
RDW: 12.5 % (ref 11.5–15.5)
WBC: 6.4 10*3/uL (ref 4.0–10.5)

## 2017-06-18 NOTE — Progress Notes (Signed)
[facility]/Trauma Progress Note      Assessment/Plan Hx of melanoma  Hx of H pylori Hx of migraines  SBO - inflammation seen at terminal ileum, suspected recurrent Crohn's based on CT from 2015, associated intussusception, partial SBO - abd xray today (10/29) showed nonspecific bowel gas pattern - GI does not think this is Crohn's, no steroids, pt will have colonoscopy with Dr. Watt Climes next week. Appreciate GI's assistance  FEN: fulls, advance to soft as tolerated VTE: SCD's, heparin ID: Zosyn 10/29>> Foley: none Follow up: TBD  DISPO: fulls and advance to soft as tolerated, close GI f/u. Likely home tomorrow morning.   LOS: 1 day    Subjective:  CC: SBO  Pt had a small caliber brown, soft BM this am. Tolerating clears, no nausea or vomiting. Abdominal pain has resolved. Feeling much better.    Objective: Vital signs in last 24 hours: Temp:  [97.9 F (36.6 C)-98.2 F (36.8 C)] 98.2 F (36.8 C) (10/30 0611) Pulse Rate:  [71-76] 71 (10/30 0611) Resp:  [16-18] 16 (10/30 0611) BP: (112-118)/(67-78) 112/70 (10/30 0611) SpO2:  [99 %] 99 % (10/30 0611) Last BM Date: 06/16/17  Intake/Output from previous day: 10/29 0701 - 10/30 0700 In: 2290 [P.O.:240; I.V.:2000; IV Piggyback:50] Out: 1400 [Urine:1400] Intake/Output this shift: No intake/output data recorded.  PE: Gen:  Alert, NAD, pleasant, cooperative Card:  RRR, no M/G/R heard Pulm:  CTA, no W/R/R, effort normal Abd: Soft, not distended, +BS, no HSM, no TTP, no guarding Skin: no rashes noted, warm and dry   Anti-infectives: Anti-infectives    Start     Dose/Rate Route Frequency Ordered Stop   06/17/17 1800  piperacillin-tazobactam (ZOSYN) IVPB 3.375 g     3.375 g 12.5 mL/hr over 240 Minutes Intravenous Every 8 hours 06/17/17 1646        Lab Results:   Recent Labs  06/17/17 0547 06/18/17 0556  WBC 7.1 6.4  HGB 13.0 13.2  HCT 37.9 37.5  PLT 155 154   BMET  Recent Labs   06/16/17 0915 06/17/17 0547  NA 135 142  K 4.0 3.5  CL 108 113*  CO2 21* 23  GLUCOSE 153* 102*  BUN 12 9  CREATININE 0.69 0.80  CALCIUM 9.2 8.1*   PT/INR No results for input(s): LABPROT, INR in the last 72 hours. CMP     Component Value Date/Time   NA 142 06/17/2017 0547   K 3.5 06/17/2017 0547   CL 113 (H) 06/17/2017 0547   CO2 23 06/17/2017 0547   GLUCOSE 102 (H) 06/17/2017 0547   BUN 9 06/17/2017 0547   CREATININE 0.80 06/17/2017 0547   CALCIUM 8.1 (L) 06/17/2017 0547   PROT 7.3 06/16/2017 0915   ALBUMIN 3.8 06/16/2017 0915   AST 32 06/16/2017 0915   ALT 46 06/16/2017 0915   ALKPHOS 86 06/16/2017 0915   BILITOT 0.6 06/16/2017 0915   GFRNONAA >60 06/17/2017 0547   GFRAA >60 06/17/2017 0547   Lipase     Component Value Date/Time   LIPASE 38 06/16/2017 0915    Studies/Results: Dg Abd 1 View  Result Date: 06/17/2017 CLINICAL DATA:  Small bowel obstruction. EXAM: ABDOMEN - 1 VIEW COMPARISON:  CT abdomen and pelvis June 16, 2017 FINDINGS: Bowel gas pattern is nondilated and nonobstructive with paucity of small bowel gas. A few loops of featureless air-filled small bowel in pelvis. No intra-abdominal mass effect or pathologic calcifications. Levoscoliosis. Lower quadrant. IMPRESSION: Nonspecific bowel gas pattern. Paucity of small bowel gas (  fluid-filled small bowel obstruction not excluded). Electronically Signed   By: Elon Alas M.D.   On: 06/17/2017 05:51   Ct Abdomen Pelvis W Contrast  Result Date: 06/16/2017 CLINICAL DATA:  Acute onset epigastric abdominal pain, some lower abdominal pain in the midline with associated nausea and vomiting. Elevated white blood cell count today. EXAM: CT ABDOMEN AND PELVIS WITH CONTRAST TECHNIQUE: Multidetector CT imaging of the abdomen and pelvis was performed using the standard protocol following bolus administration of intravenous contrast. CONTRAST:  138mL ISOVUE-300 IOPAMIDOL (ISOVUE-300) INJECTION 61% COMPARISON:  CT  abdomen dated 08/29/2013. FINDINGS: Lower chest: No acute abnormality. Hepatobiliary: No focal liver abnormality is seen. No gallstones, gallbladder wall thickening, or biliary dilatation. Pancreas: Unremarkable. No pancreatic ductal dilatation or surrounding inflammatory changes. Spleen: Normal in size without focal abnormality. Adrenals/Urinary Tract: Adrenal glands appear normal. Kidneys are unremarkable without mass, stone or hydronephrosis. No perinephric fluid. No ureteral or bladder calculi identified. Bladder is unremarkable. Stomach/Bowel: Thickening of the walls of the terminal ileum. Associated intussusception with moderate dilatation of several loops of fluid-filled ileum proximal to the TI. More proximal small bowel is normal in caliber. Stomach is nondistended. Fluid is seen throughout the decompressed colon. Appendix appears grossly normal. Vascular/Lymphatic: No significant vascular findings are present. No enlarged abdominal or pelvic lymph nodes. Reproductive: Uterus and bilateral adnexa are unremarkable. Other: Small amount of free fluid in the right lower quadrant, adjacent to the terminal ileum. No abscess collection seen. No free intraperitoneal air. Musculoskeletal: Stable scoliosis of the thoracolumbar spine. No acute or suspicious osseous finding. IMPRESSION: 1. Focal thickening of the walls of the terminal ileum suggesting infectious or inflammatory enteritis. As there were similar changes in this area on CT abdomen of 08/29/2013, I suspect recurrent Crohn's disease. 2. Associated intussusception at the terminal ileum, related to the adjacent bowel wall thickening/inflammation, resulting in partial small bowel obstruction with several loops of moderately dilated fluid-filled ileum proximal to the terminal ileum. The more proximal ileum and jejunum are nondistended. 3. Small amount of free fluid in the right lower quadrant. No abscess collection seen. No free intraperitoneal air. 4.  Scoliosis. Electronically Signed   By: Franki Cabot M.D.   On: 06/16/2017 11:39      Kalman Drape , [facility] 06/18/2017, 9:52 AM Pager: [phone] Consults: [phone] Mon-Fri 7:00 am-4:30 pm Sat-Sun 7:00 am-11:30 am

## 2017-06-18 NOTE — Progress Notes (Signed)
Raven Sharp 10:22 AM  Subjective: Patient doing fine tolerating clear liquids no pain nausea or vomiting and case discussed with surgical team  Objective: Vital signs stable afebrile no acute distress abdomen is soft nontender no new x-rays CBC okay  Assessment: Improved  Plan: Will advance diet today and probably home tomorrow and will plan outpatient colonoscopy next week at her convenience and will ask my nurse to ask her to fax a release to send me her previous colonoscopy notes and please call me if I can be of any further assistance with this hospital stay  [facility]  Pager [phone] After 5PM or if no answer call [phone]

## 2017-06-19 MED ORDER — AMOXICILLIN-POT CLAVULANATE 875-125 MG PO TABS: 1.0000 | ORAL_TABLET | Freq: Two times a day (BID) | ORAL | 0 refills | Status: AC

## 2017-06-19 NOTE — Plan of Care (Signed)
Problem: Safety: Goal: Ability to remain free from injury will improve Outcome: Progressing No safety concerns, safety precautions maintained  Problem: Pain Managment: Goal: General experience of comfort will improve Outcome: Progressing Denies pain and discomfort  Problem: Activity: Goal: Risk for activity intolerance will decrease Outcome: Progressing Tolerates activities well  Problem: Bowel/Gastric: Goal: Will not experience complications related to bowel motility Outcome: Progressing Denies gastric and bowel issues this shift

## 2017-06-19 NOTE — Progress Notes (Signed)
Pt alert, oriented, tolerating diet with no complaints of nausea or vomiting or pain.  D/C instructions and prescription was given.  All questions were answered and pt was d/cd home.

## 2017-06-19 NOTE — Discharge Instructions (Signed)
Take colace daily. Take Miralax tomorrow (11/1) if you have not yet had a BM. Eat small frequent soft meals. Avoid large meals and high fibers foods for a week. Take the Augmentin as prescribed. If you experience diarrhea stop taking the medication and call our office. Eat a yogurt once daily while taking the antibiotic. Do not eat the yogurt within 3 hours of taking the antibiotic.   Soft-Food Meal Plan A soft-food meal plan includes foods that are safe and easy to swallow. This meal plan typically is used:  If you are having trouble chewing or swallowing foods.  As a transition meal plan after only having had liquid meals for a long period.  What do I need to know about the soft-food meal plan? A soft-food meal plan includes tender foods that are soft and easy to chew and swallow. In most cases, bite-sized pieces of food are easier to swallow. A bite-sized piece is about  inch or smaller. Foods in this plan do not need to be ground or pureed. Foods that are very hard, crunchy, or sticky should be avoided. Also, breads, cereals, yogurts, and desserts with nuts, seeds, or fruits should be avoided. What foods can I eat? Grains Rice and wild rice. Moist bread, dressing, pasta, and noodles. Well-moistened dry or cooked cereals, such as farina (cooked wheat cereal), oatmeal, or grits. Biscuits, breads, muffins, pancakes, and waffles that have been well moistened. Vegetables Shredded lettuce. Cooked, tender vegetables, including potatoes without skins. Vegetable juices. Broths or creamed soups made with vegetables that are not stringy or chewy. Strained tomatoes (without seeds). Fruits Canned or well-cooked fruits. Soft (ripe), peeled fresh fruits, such as peaches, nectarines, kiwi, cantaloupe, honeydew melon, and watermelon (without seeds). Soft berries with small seeds, such as strawberries. Fruit juices (without pulp). Meats and Other Protein Sources Moist, tender, lean beef. Mutton. Lamb. Veal.  Chicken. [place]. Liver. Fish without bones. Eggs. Dairy Milk, milk drinks, and cream. Plain cream cheese and cottage cheese. Plain yogurt. Sweets/Desserts Flavored gelatin desserts. Custard. Plain ice cream, frozen yogurt, sherbet, milk shakes, and malts. Plain cakes and cookies. Plain hard candy. Other Butter, margarine (without trans fat), and cooking oils. Mayonnaise. Cream sauces. Mild spices, salt, and sugar. Syrup, molasses, honey, and jelly. The items listed above may not be a complete list of recommended foods or beverages. Contact your dietitian for more options. What foods are not recommended? Grains Dry bread, toast, crackers that have not been moistened. Coarse or dry cereals, such as bran, granola, and shredded wheat. Tough or chewy crusty breads, such as [place] bread or baguettes. Vegetables Corn. Raw vegetables except shredded lettuce. Cooked vegetables that are tough or stringy. Tough, crisp, fried potatoes and potato skins. Fruits Fresh fruits with skins or seeds or both, such as apples, pears, or grapes. Stringy, high-pulp fruits, such as papaya, pineapple, coconut, or mango. Fruit leather, fruit roll-ups, and all dried fruits. Meats and Other Protein Sources Sausages and hot dogs. Meats with gristle. Fish with bones. Nuts, seeds, and chunky peanut or other nut butters. Sweets/Desserts Cakes or cookies that are very dry or chewy. The items listed above may not be a complete list of foods and beverages to avoid. Contact your dietitian for more information. This information is not intended to replace advice given to you by your health care provider. Make sure you discuss any questions you have with your health care provider. Document Released: 11/13/2007 Document Revised: 01/12/2016 Document Reviewed: 07/03/2013 Elsevier Interactive Patient Education  2017 [employer].

## 2017-06-19 NOTE — Discharge Summary (Signed)
[facility] Discharge Summary   Patient ID: Raven Sharp MRN: [id number] DOB/AGE: Aug 22, 1970 46 y.o.  Admit date: 06/16/2017 Discharge date: 06/19/2017  Admitting Diagnosis: Partial bowel obstruction Intussusception   Discharge Diagnosis Patient Active Problem List   Diagnosis Date Noted  . SBO (small bowel obstruction) ([place]) 06/16/2017  . ACUTE BRONCHITIS 09/29/2010  . DIZZINESS 07/13/2008  . DEPRESSION 06/03/2008  . COMMON MIGRAINE 06/03/2008  . HAIR LOSS 06/03/2008  . FATIGUE 06/03/2008    Consultants Dr. Watt Climes, Gastroenterology  Imaging: No results found.  Procedures none  HPI: The patient is a 46 year old white female who presented with abdominal pain that started the day PTA. She reported the pain is severe in nature. It would last about 10-20 seconds and then ease off. This continued through the evening. She went to the emergency department where she had several episodes of vomiting. A CT scan showed what is suggestive of a distal small bowel intussusception with early partial obstruction. Pt states similar SBO episode 4 years ago with inflammation seen at terminal ileum on CT. Colonoscopy shortly after episode showed ulcerations at the ileocecal valve No surgery was required at that time.   Hospital Course:  Workup showed wall thickening at terminal ileum with associated intussusception. Patient was admitted, placed on bowel rest and IVF. GI was consulted for Crohn's workup. GI felt this was less likely IBD so pt was placed on IV antibiotics. On 10/30 pt was had a small BM and diet was advanced as tolerated. Pt was having flatus. Abdominal pain resolved. GI recommended a colonoscopy and pt scheduled that with Dr. Watt Climes for the following week. Pt was discharged on 5 additional days of PO antibiotics. On 10/31, the patient was voiding well, tolerating diet, ambulating well, pain resolved, vital signs stable, and felt stable for discharge home.   Patient will follow up in our office as needed and knows to call with questions or concerns.  Patient was discharged in good condition.  Physical Exam: Gen: Alert, NAD, pleasant, cooperative Card: RRR, no M/G/R heard Pulm: CTA, no W/R/R, effort normal Abd: Soft, not distended,+BS, no HSM, no TTP, no guarding Skin: no rashes noted, warm and dry  Allergies as of 06/19/2017      Reactions   Amitriptyline Hcl Other (See Comments)   Vertigo    Minocycline Other (See Comments)   Vertigo       Medication List    TAKE these medications   amoxicillin-clavulanate 875-125 MG tablet Commonly known as:  AUGMENTIN Take 1 tablet by mouth every 12 (twelve) hours.   cetirizine 10 MG tablet Commonly known as:  ZYRTEC Take 10 mg by mouth daily.   cholecalciferol 1000 units tablet Commonly known as:  VITAMIN D Take 5,000 Units by mouth daily.   rizatriptan 10 MG tablet Commonly known as:  MAXALT Take 10 mg by mouth.   VALERIAN ROOT PO Take 2 capsules by mouth at bedtime.   VITAMIN B2 PO Take 1 tablet by mouth daily.        Follow-up Information    Clarene Essex, MD Follow up.   Specialty:  Gastroenterology Why:  keep your appoitnment for your colonoscopy next thursday Contact information: [address] [place] [address] [facility], [place]. Call.   Specialty:  General Surgery Why:  with any questions or concerns Contact information: [address] [place] [phone]  Signed: Crist[facility] 06/19/2017, 9:23 AM Pager: [phone] Consults: [phone] Mon-Fri 7:00 am-4:30 pm Sat-Sun 7:00 am-11:30 am

## 2017-07-11 ENCOUNTER — Encounter (HOSPITAL_BASED_OUTPATIENT_CLINIC_OR_DEPARTMENT_OTHER): Payer: Self-pay | Admitting: *Deleted

## 2017-07-11 ENCOUNTER — Emergency Department (HOSPITAL_BASED_OUTPATIENT_CLINIC_OR_DEPARTMENT_OTHER): Payer: BLUE CROSS/BLUE SHIELD

## 2017-07-11 ENCOUNTER — Inpatient Hospital Stay (HOSPITAL_BASED_OUTPATIENT_CLINIC_OR_DEPARTMENT_OTHER)
Admission: EM | Admit: 2017-07-11 | Discharge: 2017-07-12 | DRG: 387 | Disposition: A | Discharge status: Home / Self Care | Payer: BLUE CROSS/BLUE SHIELD | Attending: Internal Medicine | Admitting: Internal Medicine

## 2017-07-11 ENCOUNTER — Other Ambulatory Visit: Payer: Self-pay

## 2017-07-11 ENCOUNTER — Inpatient Hospital Stay (HOSPITAL_BASED_OUTPATIENT_CLINIC_OR_DEPARTMENT_OTHER): Payer: BLUE CROSS/BLUE SHIELD

## 2017-07-11 DIAGNOSIS — K50012 Crohn's disease of small intestine with intestinal obstruction: Secondary | ICD-10-CM | POA: Diagnosis present

## 2017-07-11 DIAGNOSIS — Z881 Allergy status to other antibiotic agents status: Secondary | ICD-10-CM

## 2017-07-11 DIAGNOSIS — Z23 Encounter for immunization: Secondary | ICD-10-CM | POA: Diagnosis not present

## 2017-07-11 DIAGNOSIS — Z888 Allergy status to other drugs, medicaments and biological substances status: Secondary | ICD-10-CM

## 2017-07-11 DIAGNOSIS — G43909 Migraine, unspecified, not intractable, without status migrainosus: Secondary | ICD-10-CM | POA: Diagnosis present

## 2017-07-11 DIAGNOSIS — K56609 Unspecified intestinal obstruction, unspecified as to partial versus complete obstruction: Secondary | ICD-10-CM | POA: Diagnosis present

## 2017-07-11 DIAGNOSIS — Z79899 Other long term (current) drug therapy: Secondary | ICD-10-CM | POA: Diagnosis not present

## 2017-07-11 DIAGNOSIS — D039 Melanoma in situ, unspecified: Secondary | ICD-10-CM | POA: Diagnosis present

## 2017-07-11 DIAGNOSIS — D72829 Elevated white blood cell count, unspecified: Secondary | ICD-10-CM | POA: Diagnosis not present

## 2017-07-11 DIAGNOSIS — K50912 Crohn's disease, unspecified, with intestinal obstruction: Secondary | ICD-10-CM | POA: Diagnosis present

## 2017-07-11 DIAGNOSIS — F329 Major depressive disorder, single episode, unspecified: Secondary | ICD-10-CM | POA: Diagnosis present

## 2017-07-11 DIAGNOSIS — Z4659 Encounter for fitting and adjustment of other gastrointestinal appliance and device: Secondary | ICD-10-CM

## 2017-07-11 LAB — COMPREHENSIVE METABOLIC PANEL
ALBUMIN: 3.8 g/dL (ref 3.5–5.0)
ALK PHOS: 76 U/L (ref 38–126)
ALT: 44 U/L (ref 14–54)
AST: 39 U/L (ref 15–41)
Anion gap: 7 (ref 5–15)
BILIRUBIN TOTAL: 0.6 mg/dL (ref 0.3–1.2)
BUN: 11 mg/dL (ref 6–20)
CALCIUM: 9.1 mg/dL (ref 8.9–10.3)
CO2: 21 mmol/L — AB (ref 22–32)
CREATININE: 0.71 mg/dL (ref 0.44–1.00)
Chloride: 107 mmol/L (ref 101–111)
GFR calc non Af Amer: >60 mL/min (ref >60)
GLUCOSE: 151 mg/dL — AB (ref 65–99)
Potassium: 3.8 mmol/L (ref 3.5–5.1)
SODIUM: 135 mmol/L (ref 135–145)
TOTAL PROTEIN: 7.1 g/dL (ref 6.5–8.1)

## 2017-07-11 LAB — CBC
HCT: 44.4 % (ref 36.0–46.0)
Hemoglobin: 15.7 g/dL — ABNORMAL HIGH (ref 12.0–15.0)
MCH: 30.4 pg (ref 26.0–34.0)
MCHC: 35.4 g/dL (ref 30.0–36.0)
MCV: 86 fL (ref 78.0–100.0)
PLATELETS: 220 10*3/uL (ref 150–400)
RBC: 5.16 MIL/uL — ABNORMAL HIGH (ref 3.87–5.11)
RDW: 12.5 % (ref 11.5–15.5)
WBC: 12.6 10*3/uL — ABNORMAL HIGH (ref 4.0–10.5)

## 2017-07-11 LAB — URINALYSIS, ROUTINE W REFLEX MICROSCOPIC
BILIRUBIN URINE: NEGATIVE
Glucose, UA: NEGATIVE mg/dL
HGB URINE DIPSTICK: NEGATIVE
KETONES UR: 15 mg/dL — AB
Leukocytes, UA: NEGATIVE
NITRITE: NEGATIVE
PROTEIN: NEGATIVE mg/dL
Specific Gravity, Urine: 1.015 (ref 1.005–1.030)
pH: 6.5 (ref 5.0–8.0)

## 2017-07-11 LAB — PREGNANCY, URINE: PREG TEST UR: NEGATIVE

## 2017-07-11 LAB — LIPASE, BLOOD: Lipase: 34 U/L (ref 11–51)

## 2017-07-11 MED ORDER — ENOXAPARIN SODIUM 40 MG/0.4ML ~~LOC~~ SOLN
40.0000 mg | SUBCUTANEOUS | Status: DC
  Administered 2017-07-11: 40 mg via SUBCUTANEOUS
  Filled 2017-07-11: qty 0.4

## 2017-07-11 MED ORDER — PHENOL 1.4 % MT LIQD
1.0000 | OROMUCOSAL | Status: DC | PRN
  Filled 2017-07-11: qty 177

## 2017-07-11 MED ORDER — PIPERACILLIN-TAZOBACTAM 3.375 G IVPB
3.3750 g | Freq: Three times a day (TID) | INTRAVENOUS | Status: DC
  Administered 2017-07-11 – 2017-07-12 (×2): 3.375 g via INTRAVENOUS
  Filled 2017-07-11 (×5): qty 50

## 2017-07-11 MED ORDER — ONDANSETRON HCL 4 MG/2ML IJ SOLN
4.0000 mg | Freq: Once | INTRAMUSCULAR | Status: AC | PRN
  Administered 2017-07-11: 4 mg via INTRAVENOUS
  Filled 2017-07-11: qty 2

## 2017-07-11 MED ORDER — MORPHINE SULFATE (PF) 4 MG/ML IV SOLN
4.0000 mg | Freq: Once | INTRAVENOUS | Status: AC
  Administered 2017-07-11: 4 mg via INTRAVENOUS
  Filled 2017-07-11: qty 1

## 2017-07-11 MED ORDER — ONDANSETRON HCL 4 MG PO TABS: 4.0000 mg | ORAL_TABLET | Freq: Four times a day (QID) | ORAL | Status: DC | PRN

## 2017-07-11 MED ORDER — PROMETHAZINE HCL 25 MG/ML IJ SOLN
12.5000 mg | Freq: Once | INTRAMUSCULAR | Status: AC
  Administered 2017-07-11: 12.5 mg via INTRAVENOUS
  Filled 2017-07-11: qty 1

## 2017-07-11 MED ORDER — ONDANSETRON HCL 4 MG/2ML IJ SOLN
4.0000 mg | Freq: Four times a day (QID) | INTRAMUSCULAR | Status: DC | PRN
Start: 2017-07-11 — End: 2017-07-11
  Filled 2017-07-11: qty 2

## 2017-07-11 MED ORDER — INFLUENZA VAC SPLIT QUAD 0.5 ML IM SUSY
0.5000 mL | PREFILLED_SYRINGE | INTRAMUSCULAR | Status: AC
  Administered 2017-07-12: 0.5 mL via INTRAMUSCULAR

## 2017-07-11 MED ORDER — METHYLPREDNISOLONE SODIUM SUCC 125 MG IJ SOLR
125.0000 mg | Freq: Once | INTRAMUSCULAR | Status: AC
  Administered 2017-07-11: 125 mg via INTRAVENOUS
  Filled 2017-07-11: qty 2

## 2017-07-11 MED ORDER — ACETAMINOPHEN 650 MG RE SUPP: 650.0000 mg | Freq: Four times a day (QID) | RECTAL | Status: DC | PRN

## 2017-07-11 MED ORDER — METHYLPREDNISOLONE SODIUM SUCC 125 MG IJ SOLR
60.0000 mg | Freq: Four times a day (QID) | INTRAMUSCULAR | Status: DC
  Administered 2017-07-11 – 2017-07-12 (×4): 60 mg via INTRAVENOUS
  Filled 2017-07-11 (×4): qty 2

## 2017-07-11 MED ORDER — HYDROMORPHONE HCL 1 MG/ML IJ SOLN
1.0000 mg | INTRAMUSCULAR | Status: DC | PRN
  Administered 2017-07-11 – 2017-07-12 (×2): 1 mg via INTRAVENOUS
  Filled 2017-07-11 (×2): qty 1

## 2017-07-11 MED ORDER — HYDROMORPHONE HCL 1 MG/ML IJ SOLN
0.5000 mg | Freq: Once | INTRAMUSCULAR | Status: AC
  Administered 2017-07-11: 0.5 mg via INTRAVENOUS
  Filled 2017-07-11: qty 1

## 2017-07-11 MED ORDER — ACETAMINOPHEN 325 MG PO TABS
650.0000 mg | ORAL_TABLET | Freq: Four times a day (QID) | ORAL | Status: DC | PRN
  Administered 2017-07-11: 650 mg via ORAL
  Filled 2017-07-11: qty 2

## 2017-07-11 MED ORDER — SODIUM CHLORIDE 0.9 % IV SOLN
INTRAVENOUS | Status: AC
  Administered 2017-07-11: 08:00:00 via INTRAVENOUS

## 2017-07-11 MED ORDER — ONDANSETRON HCL 4 MG/2ML IJ SOLN: 4.0000 mg | Freq: Four times a day (QID) | INTRAMUSCULAR | Status: DC | PRN

## 2017-07-11 MED ORDER — SODIUM CHLORIDE 0.9 % IV BOLUS (SEPSIS)
1000.0000 mL | Freq: Once | INTRAVENOUS | Status: AC
  Administered 2017-07-11: 1000 mL via INTRAVENOUS

## 2017-07-11 MED ORDER — ONDANSETRON HCL 4 MG/2ML IJ SOLN
4.0000 mg | Freq: Once | INTRAMUSCULAR | Status: AC
  Administered 2017-07-11: 4 mg via INTRAVENOUS
  Filled 2017-07-11: qty 2

## 2017-07-11 MED ORDER — IOPAMIDOL (ISOVUE-300) INJECTION 61%
100.0000 mL | Freq: Once | INTRAVENOUS | Status: AC | PRN
  Administered 2017-07-11: 100 mL via INTRAVENOUS

## 2017-07-11 MED ORDER — HYDROMORPHONE HCL 1 MG/ML IJ SOLN
1.0000 mg | Freq: Once | INTRAMUSCULAR | Status: AC
  Administered 2017-07-11: 1 mg via INTRAVENOUS
  Filled 2017-07-11: qty 1

## 2017-07-11 NOTE — Consult Note (Addendum)
Referring Provider:  Dr. Leonides Schanz  Primary Care Physician:  Hermine Messick, MD Primary Gastroenterologist:  Dr. Watt Climes  Reason for Consultation:  Small bowel obstruction  HPI: Raven Sharp is a 46 y.o. female presented to [facility] ER with abdominal pain, nausea or vomiting of one day's duration. CT scan was performed which showed terminal ileum and distal small bowel loops were dilated with wall thickening and edema. No significant inflammatory stricture causing mild proximal obstruction. She was transferred to [facility] for further evaluation. Patient was admitted to the hospital in October with similar symptoms. She was diagnosed with possible distal small bowel intussusception versus early partial obstruction. There was concern for Crohn's disease.  Colonoscopy 06/27/2017 by Dr. Watt Climes showed terminal ileal stricture. He was not able to advance a anoscopy or upper endoscope through the stricture. Biopsy of terminal ileum  Showed active cryptitis in crypt distortion and findings were highly suggestive for Crohn's disease. She has follow-up scheduled on November 29 to discuss treatment options.  Patient seen and examined at bedside. She appears without any discomfort. She denies any further nausea and vomiting. Complaining of mild right lower quadrant discomfort. Her last bowel movement was yesterday. Apparently she  drank a gallon of prep on November 7 without any symptoms.  Patient had colonoscopy in January 2015 by Dr. Harl Bowie . Report from care everywhere reviewed. It showed normal-appearing colon mucosa as well as normal appearing ileum. Also had EGD at the same time which showed mild distal esophageal abnormality and biopsy shows, as epithelium with mild lymph oh sciatica esophagitis otherwise nonspecific. Gastric biopsies were also positive for H. pylori.   Past Medical History:  Diagnosis Date  . Cancer ([place])   . Depression   . H pylori ulcer   . Melanoma in situ ([place])   . Migraine      History reviewed. No pertinent surgical history.  Prior to Admission medications   Medication Sig Start Date End Date Taking? Authorizing Provider  cetirizine (ZYRTEC) 10 MG tablet Take 10 mg by mouth daily.    [provider]  cholecalciferol (VITAMIN D) 1000 units tablet Take 5,000 Units by mouth daily.     [provider]  Riboflavin (VITAMIN B2 PO) Take 1 tablet by mouth daily.    [provider]  rizatriptan (MAXALT) 10 MG tablet Take 10 mg by mouth.    [provider]  VALERIAN ROOT PO Take 2 capsules by mouth at bedtime.    [provider]    Scheduled Meds: Continuous Infusions: . sodium chloride 125 mL/hr at 07/11/17 0751   PRN Meds:.HYDROmorphone (DILAUDID) injection, ondansetron, phenol  Allergies as of 07/11/2017 - Review Complete 07/11/2017  Allergen Reaction Noted  . Amitriptyline hcl Other (See Comments) 08/29/2013  . Minocycline Other (See Comments) 08/29/2013    History reviewed. No pertinent family history.  Social History   Socioeconomic History  . Marital status: Married    Spouse name: Not on file  . Number of children: Not on file  . Years of education: Not on file  . Highest education level: Not on file  Social Needs  . Financial resource strain: Not on file  . Food insecurity - worry: Not on file  . Food insecurity - inability: Not on file  . Transportation needs - medical: Not on file  . Transportation needs - non-medical: Not on file  Occupational History  . Not on file  Tobacco Use  . Smoking status: Never Smoker  . Smokeless tobacco:  Never Used  Substance and Sexual Activity  . Alcohol use: Yes    Comment: ocassionally  . Drug use: No  . Sexual activity: Not on file  Other Topics Concern  . Not on file  Social History Narrative  . Not on file    Review of Systems: Review of Systems  Constitutional: Positive for chills. Negative for fever and weight loss.  HENT: Negative for hearing  loss and tinnitus.   Eyes: Negative for blurred vision and double vision.  Respiratory: Negative for cough, hemoptysis and sputum production.   Cardiovascular: Negative for chest pain and palpitations.  Gastrointestinal: Positive for abdominal pain, nausea and vomiting. Negative for blood in stool, constipation, diarrhea and melena.  Genitourinary: Negative for dysuria and urgency.  Musculoskeletal: Negative for myalgias and neck pain.  Skin: Negative for itching and rash.  Neurological: Negative for focal weakness and seizures.  Endo/Heme/Allergies: Does not bruise/bleed easily.  Psychiatric/Behavioral: Negative for hallucinations and suicidal ideas.    Physical Exam: Vital signs: Vitals:   07/11/17 0702 07/11/17 0730  BP: 118/84 119/86  Pulse: 78 69  Resp: 16 18  Temp:    SpO2: 97% 94%     Physical Exam  Constitutional: She is oriented to person, place, and time. She appears well-developed and well-nourished. No distress.  HENT:  Head: Normocephalic and atraumatic.  Mouth/Throat: Oropharynx is clear and moist. No oropharyngeal exudate.  Eyes: EOM are normal. Right eye exhibits no discharge. Left eye exhibits no discharge. No scleral icterus.  Neck: Normal range of motion. Neck supple. No thyromegaly present.  Cardiovascular: Normal rate, regular rhythm and normal heart sounds.  Pulmonary/Chest: Effort normal and breath sounds normal. No respiratory distress.  Abdominal: Soft. Bowel sounds are normal. She exhibits no distension. There is tenderness. There is no rebound and no guarding.  Mild right lower quadrant tenderness to palpation  Musculoskeletal: Normal range of motion. She exhibits no edema.  Neurological: She is alert and oriented to person, place, and time.  Skin: Skin is warm. No erythema.  Psychiatric: She has a normal mood and affect. Judgment and thought content normal.  Vitals reviewed.   GI:  Lab Results: Recent Labs    07/11/17 0446  WBC 12.6*  HGB  15.7*  HCT 44.4  PLT 220   BMET Recent Labs    07/11/17 0446  NA 135  K 3.8  CL 107  CO2 21*  GLUCOSE 151*  BUN 11  CREATININE 0.71  CALCIUM 9.1   LFT Recent Labs    07/11/17 0446  PROT 7.1  ALBUMIN 3.8  AST 39  ALT 44  ALKPHOS 76  BILITOT 0.6   PT/INR No results for input(s): LABPROT, INR in the last 72 hours.   Studies/Results: Ct Abdomen Pelvis W Contrast  Result Date: 07/11/2017 CLINICAL DATA:  Epigastric and left lower abdominal pain for several hours today. Nausea and vomiting. EXAM: CT ABDOMEN AND PELVIS WITH CONTRAST TECHNIQUE: Multidetector CT imaging of the abdomen and pelvis was performed using the standard protocol following bolus administration of intravenous contrast. CONTRAST:  195mL ISOVUE-300 IOPAMIDOL (ISOVUE-300) INJECTION 61% COMPARISON:  06/16/2017 FINDINGS: Lower chest: Lung bases are clear.  Small esophageal hiatal hernia. Hepatobiliary: No focal liver abnormality is seen. No gallstones, gallbladder wall thickening, or biliary dilatation. Pancreas: Unremarkable. No pancreatic ductal dilatation or surrounding inflammatory changes. Spleen: Normal in size without focal abnormality. Adrenals/Urinary Tract: Adrenal glands are unremarkable. Kidneys are normal, without renal calculi, focal lesion, or hydronephrosis. Bladder is unremarkable. Stomach/Bowel: Stomach and  colon are unremarkable. Terminal ileum and distal small bowel are dilated and fluid-filled with wall thickening and edema. There is edema in the adjacent mesenteric. Appearances are similar to previous study. This likely represents inflammatory bowel disease with inflammatory stricture causing mild proximal obstruction. Decreased fluid in the right lower quadrant and pelvis since previous study. Vascular/Lymphatic: No significant vascular findings are present. No enlarged abdominal or pelvic lymph nodes. Reproductive: Uterus and bilateral adnexa are unremarkable. Other: No free air in the abdomen.  Small amount of free fluid in the pelvis and right lower quadrant. Abdominal wall musculature appears intact. Musculoskeletal: No acute or significant osseous findings. IMPRESSION: 1. Inflammatory changes in the terminal ileum and distal small bowel likely related to inflammatory bowel disease. 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 Appearances are similar to previous study. 2. Small amount of free fluid in the pelvis and right lower quadrant, likely reactive, decreased since previous study. 3. Small esophageal hiatal hernia. Electronically Signed   By: Lucienne Capers M.D.   On: 07/11/2017 06:56   Dg Abd Portable 1v  Result Date: 07/11/2017 CLINICAL DATA:  NG placement EXAM: PORTABLE ABDOMEN - 1 VIEW COMPARISON:  CT 07/11/2017 FINDINGS: NG has been placed with the tip in the region the duodenal bulb. Stomach decompressed. No dilated bowel loops. Contrast in the collecting system from prior CT. Negative for renal obstruction IMPRESSION: NG tip in the region of the duodenal bulb.  No dilated bowel loops. Electronically Signed   By: Franchot Gallo M.D.   On: 07/11/2017 08:42    Impression/Plan: - Recently diagnosed Crohn's disease with terminal ileal stricture. Presented with nausea, vomiting and right upper quadrant pain. CT scan showed possible mild proximal small bowel obstruction from inflammatory stricture. - History of H. pylori. Status post treatment in the past. Not sure of eradication - Nausea and vomiting. Resolved now.  Recommendations ------------------------ - Patient currently appears comfortable. Denied  nausea and vomiting. Abdomen is nondistended. - Discussed with admitting team. Recommend starting clear liquid diet and advance to full liquid as tolerated. - Continue IV steroids for now. Consider switching to oral steroid tomorrow if able to tolerate diet - QuantiFERON-TB test  and hepatitis panel anticipation of starting possible biological agent as an  outpatient - Patient  has a follow-up scheduled with Dr. Watt Climes on 07/18/2017.  - If symptoms are persistent, recommend MR enterography to determine inflammatory versus fibrotic stricture. - Old records reviewed and summarized. Procedure report and path report from recent colonoscopy reviewed. - GI will follow    LOS: 0 days   Otis Brace  MD, FACP 07/11/2017, 10:34 AM  Contact #  [phone]

## 2017-07-11 NOTE — H&P (Addendum)
History and Physical    Raven Sharp PNT:[id number] DOB: 04-Mar-1971 DOA: 07/11/2017  Referring MD/NP/PA: [facility]- ward PCP: Hermine Messick, MD Outpatient Specialists: [facility] Patient coming from: [facility]home  Chief Complaint: abdominal pain  HPI: Raven Sharp is a 46 y.o. 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  She was recently in the hospital from 10/28-10/31 under the surgical service for small bowel intussusception with early partial obstruction, she improved with conservative management and was d/c'd to have outpatient colonoscopy.  She has a colonoscopy last week with Dr. Tami Lin and preliminary results are suggestive of Crohns.  She developed today LLQ abdominal pain with nausea and vomiting.  No diarrhea per se but states her bowels are always "mushy". She has an up coming appointment to discuss treatment options with GI.   In the ER at [facility] she had labs that showed mildly elevated WBC count and a CT scan that showed similar results to prior with: Inflammatory changes in the terminal ileum and distal small bowel likely related to inflammatory bowel disease. 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  An NG tube was attempted but caused pain and vomiting so it was removed.      Review of Systems: all systems reviewed, negative unless stated above in HPI   Past Medical History:  Diagnosis Date  . Cancer ([place])   . Depression   . H pylori ulcer   . Melanoma in situ ([place])   . Migraine     History reviewed. No pertinent surgical history.   reports that  has never smoked. she has never used smokeless tobacco. She reports that she drinks alcohol. She reports that she does not use drugs.  Allergies  Allergen Reactions  . Amitriptyline Hcl Other (See Comments)    Vertigo   . Minocycline Other (See Comments)    Vertigo     Family History  Problem Relation Age of Onset  . Colon  cancer Sister     Prior to Admission medications   Medication Sig Start Date End Date Taking? Authorizing Provider  cetirizine (ZYRTEC) 10 MG tablet Take 10 mg by mouth daily.    [provider]  cholecalciferol (VITAMIN D) 1000 units tablet Take 5,000 Units by mouth daily.     [provider]  Riboflavin (VITAMIN B2 PO) Take 1 tablet by mouth daily.    [provider]  rizatriptan (MAXALT) 10 MG tablet Take 10 mg by mouth.    [provider]  VALERIAN ROOT PO Take 2 capsules by mouth at bedtime.    [provider]    Physical Exam: Vitals:   07/11/17 0436 07/11/17 0702 07/11/17 0730 07/11/17 1040  BP:  118/84 119/86 104/67  Pulse:  78 69 74  Resp:  16 18 18   Temp:    97.6 F (36.4 C)  TempSrc:    Oral  SpO2:  97% 94% 100%  Weight: 68 kg (150 lb)   70.2 kg (154 lb 12.2 oz)  Height: 5\' 2"  (1.575 m)   5\' 2"  (1.575 m)      Constitutional: NAD, calm, comfortable Vitals:   07/11/17 0436 07/11/17 0702 07/11/17 0730 07/11/17 1040  BP:  118/84 119/86 104/67  Pulse:  78 69 74  Resp:  16 18 18   Temp:    97.6 F (36.4 C)  TempSrc:    Oral  SpO2:  97% 94% 100%  Weight: 68 kg (150 lb)   70.2  kg (154 lb 12.2 oz)  Height: 5\' 2"  (1.575 m)   5\' 2"  (1.575 m)   Eyes: PERRL, lids and conjunctivae normal ENMT: Mucous membranes are moist. Posterior pharynx clear of any exudate or lesions.Normal dentition.  Neck: normal, supple, no masses, no thyromegaly Respiratory: clear to auscultation bilaterally, no wheezing, no crackles. Normal respiratory effort. No accessory muscle use.  Cardiovascular: Regular rate and rhythm, no murmurs / rubs / gallops. No extremity edema. 2+ pedal pulses. No carotid bruits.  Abdomen: mild LLQ tenderness, no masses palpated. No hepatosplenomegaly. Bowel sounds positive.  Musculoskeletal: no clubbing / cyanosis. No joint deformity upper and lower extremities. Good ROM, no contractures. Normal muscle tone.  Skin: no  rashes, lesions, ulcers. No induration Neurologic: CN 2-12 grossly intact. Sensation intact, DTR normal. Strength 5/5 in all 4.  Psychiatric: Normal judgment and insight. Alert and oriented x 3. Normal mood.     Labs on Admission: I have personally reviewed following labs and imaging studies  CBC: Recent Labs  Lab 07/11/17 0446  WBC 12.6*  HGB 15.7*  HCT 44.4  MCV 86.0  PLT 355   Basic Metabolic Panel: Recent Labs  Lab 07/11/17 0446  NA 135  K 3.8  CL 107  CO2 21*  GLUCOSE 151*  BUN 11  CREATININE 0.71  CALCIUM 9.1   GFR: Estimated Creatinine Clearance: 81.5 mL/min (by C-G formula based on SCr of 0.71 mg/dL). Liver Function Tests: Recent Labs  Lab 07/11/17 0446  AST 39  ALT 44  ALKPHOS 76  BILITOT 0.6  PROT 7.1  ALBUMIN 3.8   Recent Labs  Lab 07/11/17 0446  LIPASE 34   No results for input(s): AMMONIA in the last 168 hours. Coagulation Profile: No results for input(s): INR, PROTIME in the last 168 hours. Cardiac Enzymes: No results for input(s): CKTOTAL, CKMB, CKMBINDEX, TROPONINI in the last 168 hours. BNP (last 3 results) No results for input(s): PROBNP in the last 8760 hours. HbA1C: No results for input(s): HGBA1C in the last 72 hours. CBG: No results for input(s): GLUCAP in the last 168 hours. Lipid Profile: No results for input(s): CHOL, HDL, LDLCALC, TRIG, CHOLHDL, LDLDIRECT in the last 72 hours. Thyroid Function Tests: No results for input(s): TSH, T4TOTAL, FREET4, T3FREE, THYROIDAB in the last 72 hours. Anemia Panel: No results for input(s): VITAMINB12, FOLATE, FERRITIN, TIBC, IRON, RETICCTPCT in the last 72 hours. Urine analysis:    Component Value Date/Time   COLORURINE YELLOW 07/11/2017 0446   APPEARANCEUR CLOUDY (A) 07/11/2017 0446   LABSPEC 1.015 07/11/2017 0446   PHURINE 6.5 07/11/2017 0446   GLUCOSEU NEGATIVE 07/11/2017 0446   HGBUR NEGATIVE 07/11/2017 0446   BILIRUBINUR NEGATIVE 07/11/2017 0446   KETONESUR 15 (A) 07/11/2017  0446   PROTEINUR NEGATIVE 07/11/2017 0446   UROBILINOGEN 0.2 08/29/2013 0117   NITRITE NEGATIVE 07/11/2017 0446   LEUKOCYTESUR NEGATIVE 07/11/2017 0446   Sepsis Labs: Invalid input(s): PROCALCITONIN, LACTICIDVEN No results found for this or any previous visit (from the past 240 hour(s)).   Radiological Exams on Admission: Ct Abdomen Pelvis W Contrast  Result Date: 07/11/2017 CLINICAL DATA:  Epigastric and left lower abdominal pain for several hours today. Nausea and vomiting. EXAM: CT ABDOMEN AND PELVIS WITH CONTRAST TECHNIQUE: Multidetector CT imaging of the abdomen and pelvis was performed using the standard protocol following bolus administration of intravenous contrast. CONTRAST:  166mL ISOVUE-300 IOPAMIDOL (ISOVUE-300) INJECTION 61% COMPARISON:  06/16/2017 FINDINGS: Lower chest: Lung bases are clear.  Small esophageal hiatal hernia. Hepatobiliary: No focal liver  abnormality is seen. No gallstones, gallbladder wall thickening, or biliary dilatation. Pancreas: Unremarkable. No pancreatic ductal dilatation or surrounding inflammatory changes. Spleen: Normal in size without focal abnormality. Adrenals/Urinary Tract: Adrenal glands are unremarkable. Kidneys are normal, without renal calculi, focal lesion, or hydronephrosis. Bladder is unremarkable. Stomach/Bowel: Stomach and colon are unremarkable. Terminal ileum and distal small bowel are dilated and fluid-filled with wall thickening and edema. There is edema in the adjacent mesenteric. Appearances are similar to previous study. This likely represents inflammatory bowel disease with inflammatory stricture causing mild proximal obstruction. Decreased fluid in the right lower quadrant and pelvis since previous study. Vascular/Lymphatic: No significant vascular findings are present. No enlarged abdominal or pelvic lymph nodes. Reproductive: Uterus and bilateral adnexa are unremarkable. Other: No free air in the abdomen. Small amount of free fluid in the  pelvis and right lower quadrant. Abdominal wall musculature appears intact. Musculoskeletal: No acute or significant osseous findings. IMPRESSION: 1. Inflammatory changes in the terminal ileum and distal small bowel likely related to inflammatory bowel disease. 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 Appearances are similar to previous study. 2. Small amount of free fluid in the pelvis and right lower quadrant, likely reactive, decreased since previous study. 3. Small esophageal hiatal hernia. Electronically Signed   By: Lucienne Capers M.D.   On: 07/11/2017 06:56   Dg Abd Portable 1v  Result Date: 07/11/2017 CLINICAL DATA:  NG placement EXAM: PORTABLE ABDOMEN - 1 VIEW COMPARISON:  CT 07/11/2017 FINDINGS: NG has been placed with the tip in the region the duodenal bulb. Stomach decompressed. No dilated bowel loops. Contrast in the collecting system from prior CT. Negative for renal obstruction IMPRESSION: NG tip in the region of the duodenal bulb.  No dilated bowel loops. Electronically Signed   By: Franchot Gallo M.D.   On: 07/11/2017 08:42      Assessment/Plan Active Problems:   SBO (small bowel obstruction) (HCC)   Exacerbation of Crohn's disease with intestinal obstruction (HCC)   Leukocytosis   Abdominal pain due to exacerbation of Crohns -no current sign of obstruction -CT scan: Inflammatory changes in the terminal ileum and distal small bowel likely related to inflammatory bowel disease. 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 -GI consult -IV abx -IV Steroids  leuckotysis -IV Abx -CBC in AM  Melanoma in situ -stable    DVT prophylaxis: lovenox Code Status: full Family Communication: patient Disposition Plan: home Consults called: GI ([place]) Admission status: inpt   Geradine Girt DO [facility] Pager [phone]  If 7PM-7AM, please contact night-coverage [email or web address] Password [facility]  07/11/2017, 11:19 AM

## 2017-07-11 NOTE — ED Notes (Signed)
Patient continues to vomit around the tube even after repositioning  - the patients NG tube taken out and MD aware

## 2017-07-11 NOTE — ED Notes (Signed)
Patient states that she is having increased pain in her stomach and nausea - Green bile in tube

## 2017-07-11 NOTE — ED Provider Notes (Signed)
TIME SEEN: 4:56 AM  CHIEF COMPLAINT: Abdominal pain, nausea and vomiting  HPI: Patient is a 46 year old female with recent diagnosis of Crohn's disease who is followed by Dr. Watt Climes with no previous abdominal surgeries who presents to the emergency room last bowel movement yesterday.  Has been passing related the end of October for bowel obstruction and states this feels similar.  No fevers.  No vaginal bleeding, discharge, dysuria or hematuria.  Last menstrual period was November 9.  She is not yet on any medication for her Crohn's disease.  States she was just diagnosed with Crohn's disease recently from a colonoscopy and tissue biopsy by [facility].  ROS: See HPI Constitutional: no fever  Eyes: no drainage  ENT: no runny nose   Cardiovascular:  no chest pain  Resp: no SOB  GI:  vomiting GU: no dysuria Integumentary: no rash  Allergy: no hives  Musculoskeletal: no leg swelling  Neurological: no slurred speech ROS otherwise negative  PAST MEDICAL HISTORY/PAST SURGICAL HISTORY:  Past Medical History:  Diagnosis Date  . Cancer ([place])   . Depression   . H pylori ulcer   . Melanoma in situ ([place])   . Migraine     MEDICATIONS:  Prior to Admission medications   Medication Sig Start Date End Date Taking? Authorizing Provider  cetirizine (ZYRTEC) 10 MG tablet Take 10 mg by mouth daily.    [provider]  cholecalciferol (VITAMIN D) 1000 units tablet Take 5,000 Units by mouth daily.     [provider]  Riboflavin (VITAMIN B2 PO) Take 1 tablet by mouth daily.    [provider]  rizatriptan (MAXALT) 10 MG tablet Take 10 mg by mouth.    [provider]  VALERIAN ROOT PO Take 2 capsules by mouth at bedtime.    [provider]    ALLERGIES:  Allergies  Allergen Reactions  . Amitriptyline Hcl Other (See Comments)    Vertigo   . Minocycline Other (See Comments)    Vertigo     SOCIAL HISTORY:  Social History   Tobacco Use  . Smoking  status: Never Smoker  . Smokeless tobacco: Never Used  Substance Use Topics  . Alcohol use: Yes    Comment: ocassionally    FAMILY HISTORY: History reviewed. No pertinent family history.  EXAM: BP (!) 137/92 (BP Location: Right Arm)   Pulse 92   Temp 97.9 F (36.6 C) (Oral)   Resp 18   Ht 5\' 2"  (1.575 m)   Wt 68 kg (150 lb)   LMP 06/28/2017   SpO2 100%   BMI 27.44 kg/m  CONSTITUTIONAL: Alert and oriented and responds appropriately to questions.  Appears very uncomfortable, afebrile, nontoxic HEAD: Normocephalic EYES: Conjunctivae clear, pupils appear equal, EOMI ENT: normal nose; moist mucous membranes NECK: Supple, no meningismus, no nuchal rigidity, no LAD  CARD: RRR; S1 and S2 appreciated; no murmurs, no clicks, no rubs, no gallops RESP: Normal chest excursion without splinting or tachypnea; breath sounds clear and equal bilaterally; no wheezes, no rhonchi, no rales, no hypoxia or respiratory distress, speaking full sentences ABD/GI: Normal bowel sounds; non-distended; soft, tender throughout the abdomen especially in the upper abdomen and right lower quadrant, no peritoneal signs BACK:  The back appears normal and is non-tender to palpation, there is no CVA tenderness EXT: Normal ROM in all joints; non-tender to palpation; no edema; normal capillary refill; no cyanosis, no calf tenderness or swelling    SKIN: Normal color for age and race;  warm; no rash NEURO: Moves all extremities equally PSYCH: The patient's mood and manner are appropriate. Grooming and personal hygiene are appropriate.  MEDICAL DECISION MAKING: Patient here with abdominal pain.  States that she has been told that she has a "small ileum" on recent colonoscopy.  States she was told that she has Crohn's disease.  She is not on medications for this yet.  Differential diagnosis today includes bowel obstruction, Crohn's flare, infectious colitis, diverticulitis, appendicitis, gastritis, cholelithiasis,  pancreatitis.  Will obtain labs, urine and a CT of abdomen pelvis.  Will give IV fluids, pain and nausea medicine.  ED PROGRESS: 6:30 AM  Patient's labs show mild leukocytosis.  Urine shows small amount of ketones but no infection.  Pregnancy test is negative.  CT of the abdomen and pelvis pending.   7:05 AM Patient's CT scan shows inflammatory changes in the terminal ileum and distal small bowel likely related to her inflammatory bowel disease.  She has an inflammatory stricture with a small bowel obstruction.  Last time she was admitted to the surgical service.  We will discuss the case with them first as well as GI for their recommendations including giving steroids.  Will place nasogastric tube.  7:30 AM  D/w Dr. Excell Seltzer with general surgery.  Appreciate his help and prompt response.  He agrees with medical admission given this is caused by her Crohn's disease.  They will see the patient in consult.   7:35 AM  Pt refusing gastric tube.  States previously this resolved without an NG tube in place.  Discussed with Dr. Alessandra Bevels with equal gastroenterology.  He agrees with IV steroids and medicine admission.  GI will see patient in consult.   7:40 AM Discussed patient's case with hospitalist, Dr. Eliseo Squires.  I have recommended admission and patient (and family if present) agree with this plan.  I will place admission orders to a med/surge bed, inpatient.  Discussed with hospitalist who agrees.  I reviewed all nursing notes, vitals, pertinent previous records, EKGs, lab and urine results, imaging (as available).      Ward, Delice Bison, DO 07/11/17 [phone]

## 2017-07-11 NOTE — Progress Notes (Signed)
Pharmacy Antibiotic Note  Raven Sharp is a 46 y.o. female with hx crohn's disease presented to the ED on 07/11/2017 with c/o n/v and abd pain.  Abd CTA showed "Inflammatory changes in the terminal ileum and distal small bowel likely related to inflammatory bowel disease. 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  Plan: - zosyn 3.375 gm IV q8h (infuse over 4 hours) - with good renal function, pharmacy will sign off for zosyn.  Re-consult [place] if need further assistance.  __________________________________  Height: 5\' 2"  (157.5 cm) Weight: 154 lb 12.2 oz (70.2 kg) IBW/kg (Calculated) : 50.1  Temp (24hrs), Avg:97.8 F (36.6 C), Min:97.6 F (36.4 C), Max:97.9 F (36.6 C)  Recent Labs  Lab 07/11/17 0446  WBC 12.6*  CREATININE 0.71    Estimated Creatinine Clearance: 81.5 mL/min (by C-G formula based on SCr of 0.71 mg/dL).    Allergies  Allergen Reactions  . Amitriptyline Hcl Other (See Comments)    Vertigo   . Minocycline Other (See Comments)    Vertigo      Thank you for allowing pharmacy to be a part of this patient's care.  Lynelle Doctor 07/11/2017 11:10 AM

## 2017-07-11 NOTE — ED Triage Notes (Signed)
Epigastric and left lower abdominal pain x several hours today. Hx of SBO on 10.28.18.  Reports N/V.

## 2017-07-11 NOTE — ED Notes (Signed)
Report given to [place] on [place]

## 2017-07-12 LAB — CBC
HEMATOCRIT: 37 % (ref 36.0–46.0)
HEMOGLOBIN: 12.8 g/dL (ref 12.0–15.0)
MCH: 30.9 pg (ref 26.0–34.0)
MCHC: 34.6 g/dL (ref 30.0–36.0)
MCV: 89.4 fL (ref 78.0–100.0)
Platelets: 185 10*3/uL (ref 150–400)
RBC: 4.14 MIL/uL (ref 3.87–5.11)
RDW: 12.8 % (ref 11.5–15.5)
WBC: 16.4 10*3/uL — ABNORMAL HIGH (ref 4.0–10.5)

## 2017-07-12 LAB — BASIC METABOLIC PANEL
ANION GAP: 6 (ref 5–15)
BUN: 8 mg/dL (ref 6–20)
CHLORIDE: 111 mmol/L (ref 101–111)
CO2: 23 mmol/L (ref 22–32)
Calcium: 8.6 mg/dL — ABNORMAL LOW (ref 8.9–10.3)
Creatinine, Ser: 0.69 mg/dL (ref 0.44–1.00)
GFR calc Af Amer: >60 mL/min (ref >60)
GFR calc non Af Amer: >60 mL/min (ref >60)
GLUCOSE: 141 mg/dL — AB (ref 65–99)
POTASSIUM: 3.9 mmol/L (ref 3.5–5.1)
Sodium: 140 mmol/L (ref 135–145)

## 2017-07-12 LAB — HEPATITIS B SURFACE ANTIGEN: HEP B S AG: NEGATIVE

## 2017-07-12 LAB — HCV COMMENT:

## 2017-07-12 LAB — HEPATITIS B CORE ANTIBODY, TOTAL: Hep B Core Total Ab: NEGATIVE

## 2017-07-12 LAB — HEPATITIS B SURFACE ANTIBODY,QUALITATIVE: Hep B S Ab: NONREACTIVE

## 2017-07-12 LAB — HEPATITIS C ANTIBODY (REFLEX)

## 2017-07-12 MED ORDER — PREDNISONE 20 MG PO TABS: 40.0000 mg | ORAL_TABLET | Freq: Every day | ORAL | 0 refills | Status: DC

## 2017-07-12 NOTE — Progress Notes (Signed)
Date: July 12, 2017 Chart review for discharge needs:  None found for case management. Patient has no questions concerning post hospital care.

## 2017-07-12 NOTE — Progress Notes (Signed)
Patient refused shower and linen change. Said she is probably being discharged today. If she changes her mind she will call out. Patient did brush teeth.

## 2017-07-12 NOTE — Discharge Summary (Signed)
Physician Discharge Summary  Raven Sharp:[id number] DOB: 06/02/1971 DOA: 07/11/2017  PCP: Raven Messick, MD  Admit date: 07/11/2017 Discharge date: 07/12/2017  Time spent: 45 minutes  Recommendations for Outpatient Follow-up:  1. Gi Raven Sharp on 11/29-next week   Discharge Diagnoses:  Active Problems:   SBO (small bowel obstruction) (HCC)   Exacerbation of Crohn's disease with intestinal obstruction (HCC)   Leukocytosis   Inflammatory stricture   Discharge Condition: stable  Diet recommendation: soft diet  Filed Weights   07/11/17 0436 07/11/17 1040  Weight: 68 kg (150 lb) 70.2 kg (154 lb 12.2 oz)    History of present illness:  Raven Sharp is a 46 y.o. 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  She was recently in the hospital from 10/28-10/31 under the surgical service for small bowel intussusception with early partial obstruction, she improved with conservative management and was d/c'd to have outpatient colonoscopy.  She has a colonoscopy last week with Dr. Watt Sharp and preliminary results are suggestive of Crohns.  She developed LLQ abdominal pain with nausea and vomiting yesterday  Hospital Course:  1. Crohns disease with terminal ileal stricture -recent diagnosis from colonsocopy and biopsy last week -admitted with mild SBO  likely from TI stricture which improved with bowel rest and IV steroids -followed by [facility] -now improved, tolerating soft diet, transitioned to PO Prednisone -will FU next week 11/29 with Raven Sharp to plan further Rx of Chrohns disease   Consultations:  [facility]  Discharge Exam: Vitals:   07/11/17 2038 07/12/17 0636  BP: 111/68 128/74  Pulse: 87 75  Resp: 16 16  Temp: 98.2 F (36.8 C) 98.5 F (36.9 C)  SpO2: 100% 99%    General: AAOx3 Cardiovascular: S1S2/RRR Respiratory: CTAB  Discharge Instructions   Discharge Instructions     Discharge instructions   Complete by:  As directed    Soft bland diet as tolerated   Increase activity slowly   Complete by:  As directed      Discharge Medication List as of 07/12/2017 11:50 AM    START taking these medications   Details  predniSONE (DELTASONE) 20 MG tablet Take 2 tablets (40 mg total) by mouth daily. Take this dose until your appt with Raven Sharp, Starting Fri 07/12/2017, Print      CONTINUE these medications which have NOT CHANGED   Details  acetaminophen (TYLENOL) 500 MG tablet Take 1,000 mg by mouth every 6 (six) hours as needed for mild pain., Historical Med    carboxymethylcellulose (REFRESH PLUS) 0.5 % SOLN Place 1 drop into both eyes at bedtime., Historical Med    cetirizine (ZYRTEC) 10 MG tablet Take 10 mg by mouth daily., Historical Med    cholecalciferol (VITAMIN D) 1000 units tablet Take 5,000 Units by mouth daily. , Historical Med    OVER THE COUNTER MEDICATION Take 1 tablet by mouth 2 (two) times daily. Estrogen Balance , Historical Med    Riboflavin (VITAMIN B2 PO) Take 1 tablet by mouth 2 (two) times daily. , Historical Med    rizatriptan (MAXALT) 10 MG tablet Take 10 mg by mouth., Historical Med    sodium chloride (OCEAN) 0.65 % SOLN nasal spray Place 1 spray into both nostrils as needed for congestion., Historical Med       Allergies  Allergen Reactions  . Amitriptyline Hcl Other (See Comments)    Vertigo   . Minocycline Other (See Comments)    Vertigo  Follow-up Information    Raven Essex, MD Follow up on 07/18/2017.   Specialty:  Gastroenterology Contact information: [address] [place] [address] [phone]            The results of significant diagnostics from this hospitalization (including imaging, microbiology, ancillary and laboratory) are listed below for reference.    Significant Diagnostic Studies: Dg Abd 1 View  Result Date: 06/17/2017 CLINICAL DATA:  Small bowel obstruction. EXAM:  ABDOMEN - 1 VIEW COMPARISON:  CT abdomen and pelvis June 16, 2017 FINDINGS: Bowel gas pattern is nondilated and nonobstructive with paucity of small bowel gas. A few loops of featureless air-filled small bowel in pelvis. No intra-abdominal mass effect or pathologic calcifications. Levoscoliosis. Lower quadrant. IMPRESSION: Nonspecific bowel gas pattern. Paucity of small bowel gas (fluid-filled small bowel obstruction not excluded). Electronically Signed   By: Raven Sharp M.D.   On: 06/17/2017 05:51   Ct Abdomen Pelvis W Contrast  Result Date: 07/11/2017 CLINICAL DATA:  Epigastric and left lower abdominal pain for several hours today. Nausea and vomiting. EXAM: CT ABDOMEN AND PELVIS WITH CONTRAST TECHNIQUE: Multidetector CT imaging of the abdomen and pelvis was performed using the standard protocol following bolus administration of intravenous contrast. CONTRAST:  129mL ISOVUE-300 IOPAMIDOL (ISOVUE-300) INJECTION 61% COMPARISON:  06/16/2017 FINDINGS: Lower chest: Lung bases are clear.  Small esophageal hiatal hernia. Hepatobiliary: No focal liver abnormality is seen. No gallstones, gallbladder wall thickening, or biliary dilatation. Pancreas: Unremarkable. No pancreatic ductal dilatation or surrounding inflammatory changes. Spleen: Normal in size without focal abnormality. Adrenals/Urinary Tract: Adrenal glands are unremarkable. Kidneys are normal, without renal calculi, focal lesion, or hydronephrosis. Bladder is unremarkable. Stomach/Bowel: Stomach and colon are unremarkable. Terminal ileum and distal small bowel are dilated and fluid-filled with wall thickening and edema. There is edema in the adjacent mesenteric. Appearances are similar to previous study. This likely represents inflammatory bowel disease with inflammatory stricture causing mild proximal obstruction. Decreased fluid in the right lower quadrant and pelvis since previous study. Vascular/Lymphatic: No significant vascular findings are  present. No enlarged abdominal or pelvic lymph nodes. Reproductive: Uterus and bilateral adnexa are unremarkable. Other: No free air in the abdomen. Small amount of free fluid in the pelvis and right lower quadrant. Abdominal wall musculature appears intact. Musculoskeletal: No acute or significant osseous findings. IMPRESSION: 1. Inflammatory changes in the terminal ileum and distal small bowel likely related to inflammatory bowel disease. 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 Appearances are similar to previous study. 2. Small amount of free fluid in the pelvis and right lower quadrant, likely reactive, decreased since previous study. 3. Small esophageal hiatal hernia. Electronically Signed   By: Lucienne Capers M.D.   On: 07/11/2017 06:56   Ct Abdomen Pelvis W Contrast  Result Date: 06/16/2017 CLINICAL DATA:  Acute onset epigastric abdominal pain, some lower abdominal pain in the midline with associated nausea and vomiting. Elevated white blood cell count today. EXAM: CT ABDOMEN AND PELVIS WITH CONTRAST TECHNIQUE: Multidetector CT imaging of the abdomen and pelvis was performed using the standard protocol following bolus administration of intravenous contrast. CONTRAST:  165mL ISOVUE-300 IOPAMIDOL (ISOVUE-300) INJECTION 61% COMPARISON:  CT abdomen dated 08/29/2013. FINDINGS: Lower chest: No acute abnormality. Hepatobiliary: No focal liver abnormality is seen. No gallstones, gallbladder wall thickening, or biliary dilatation. Pancreas: Unremarkable. No pancreatic ductal dilatation or surrounding inflammatory changes. Spleen: Normal in size without focal abnormality. Adrenals/Urinary Tract: Adrenal glands appear normal. Kidneys are unremarkable without mass, stone or hydronephrosis. No  perinephric fluid. No ureteral or bladder calculi identified. Bladder is unremarkable. Stomach/Bowel: Thickening of the walls of the terminal ileum. Associated intussusception with moderate dilatation of  several loops of fluid-filled ileum proximal to the TI. More proximal small bowel is normal in caliber. Stomach is nondistended. Fluid is seen throughout the decompressed colon. Appendix appears grossly normal. Vascular/Lymphatic: No significant vascular findings are present. No enlarged abdominal or pelvic lymph nodes. Reproductive: Uterus and bilateral adnexa are unremarkable. Other: Small amount of free fluid in the right lower quadrant, adjacent to the terminal ileum. No abscess collection seen. No free intraperitoneal air. Musculoskeletal: Stable scoliosis of the thoracolumbar spine. No acute or suspicious osseous finding. IMPRESSION: 1. Focal thickening of the walls of the terminal ileum suggesting infectious or inflammatory enteritis. As there were similar changes in this area on CT abdomen of 08/29/2013, I suspect recurrent Crohn's disease. 2. Associated intussusception at the terminal ileum, related to the adjacent bowel wall thickening/inflammation, resulting in partial small bowel obstruction with several loops of moderately dilated fluid-filled ileum proximal to the terminal ileum. The more proximal ileum and jejunum are nondistended. 3. Small amount of free fluid in the right lower quadrant. No abscess collection seen. No free intraperitoneal air. 4. Scoliosis. Electronically Signed   By: Franki Cabot M.D.   On: 06/16/2017 11:39   Dg Abd Portable 1v  Result Date: 07/11/2017 CLINICAL DATA:  NG placement EXAM: PORTABLE ABDOMEN - 1 VIEW COMPARISON:  CT 07/11/2017 FINDINGS: NG has been placed with the tip in the region the duodenal bulb. Stomach decompressed. No dilated bowel loops. Contrast in the collecting system from prior CT. Negative for renal obstruction IMPRESSION: NG tip in the region of the duodenal bulb.  No dilated bowel loops. Electronically Signed   By: Franchot Gallo M.D.   On: 07/11/2017 08:42    Microbiology: No results found for this or any previous visit (from the past 240  hour(s)).   Labs: Basic Metabolic Panel: Recent Labs  Lab 07/11/17 0446 07/12/17 0533  NA 135 140  K 3.8 3.9  CL 107 111  CO2 21* 23  GLUCOSE 151* 141*  BUN 11 8  CREATININE 0.71 0.69  CALCIUM 9.1 8.6*   Liver Function Tests: Recent Labs  Lab 07/11/17 0446  AST 39  ALT 44  ALKPHOS 76  BILITOT 0.6  PROT 7.1  ALBUMIN 3.8   Recent Labs  Lab 07/11/17 0446  LIPASE 34   No results for input(s): AMMONIA in the last 168 hours. CBC: Recent Labs  Lab 07/11/17 0446 07/12/17 0533  WBC 12.6* 16.4*  HGB 15.7* 12.8  HCT 44.4 37.0  MCV 86.0 89.4  PLT 220 185   Cardiac Enzymes: No results for input(s): CKTOTAL, CKMB, CKMBINDEX, TROPONINI in the last 168 hours. BNP: BNP (last 3 results) No results for input(s): BNP in the last 8760 hours.  ProBNP (last 3 results) No results for input(s): PROBNP in the last 8760 hours.  CBG: No results for input(s): GLUCAP in the last 168 hours.     Signed:  Domenic Polite MD.  [facility] 07/12/2017, 1:56 PM

## 2017-07-12 NOTE — Care Management Note (Signed)
Case Management Note  Patient Details  Name: Raven Sharp MRN: [id number] Date of Birth: 10-08-1970  Subjective/Objective:                  Sbo, npo,iv flds, ngtube to suction  Action/Plan: Date: July 12, 2017 Velva Harman, BSN, [place], [place] Chart and notes review for patient progress and needs. Will follow for case management and discharge needs. Next review date: [id number]  Expected Discharge Date:  07/11/17               Expected Discharge Plan:  Home/Self Care  In-House Referral:     Discharge planning Services  CM Consult  Post Acute Care Choice:    Choice offered to:     DME Arranged:    DME Agency:     HH Arranged:    HH Agency:     Status of Service:  In process, will continue to follow  If discussed at Long Length of Stay Meetings, dates discussed:    Additional Comments:  Leeroy Cha, RN 07/12/2017, 8:53 AM

## 2017-07-12 NOTE — Progress Notes (Signed)
Discharge instructions and medications discussed with patient.  Prescription and AVS given to patient.  All questions answered. Patient wants to eat lunch prior to leaving.

## 2017-07-12 NOTE — Progress Notes (Signed)
[facility] Progress Note  Subjective: The patient is doing well today. She is eating. She denies abdominal pain. She denies vomiting. She is having bowel movements. She would like to go home.  Objective: Vital signs in last 24 hours: Temp:  [97.6 F (36.4 C)-98.5 F (36.9 C)] 98.5 F (36.9 C) (11/23 0636) Pulse Rate:  [74-87] 75 (11/23 0636) Resp:  [16-18] 16 (11/23 0636) BP: (104-128)/(67-74) 128/74 (11/23 0636) SpO2:  [99 %-100 %] 99 % (11/23 0636) Weight:  [70.2 kg (154 lb 12.2 oz)] 70.2 kg (154 lb 12.2 oz) (11/22 1040) Weight change: 2.161 kg (4 lb 12.2 oz)   PE:  No distress  Heart regular rhythm  Abdomen soft nontender  Lab Results: Results for orders placed or performed during the hospital encounter of 07/11/17 (from the past 24 hour(s))  Hepatitis B surface antibody     Status: None   Collection Time: 07/11/17 11:23 AM  Result Value Ref Range   Hep B S Ab Non Reactive   Hepatitis B core antibody, total     Status: None   Collection Time: 07/11/17 11:23 AM  Result Value Ref Range   Hep B Core Total Ab Negative Negative  Hepatitis c antibody (reflex)     Status: None   Collection Time: 07/11/17 11:23 AM  Result Value Ref Range   HCV Ab <0.1 0.0 - 0.9 s/co ratio  HCV Comment:     Status: None   Collection Time: 07/11/17 11:23 AM  Result Value Ref Range   Comment: Comment   Hepatitis B surface antigen     Status: None   Collection Time: 07/11/17 11:31 AM  Result Value Ref Range   Hepatitis B Surface Ag Negative Negative  Basic metabolic panel     Status: Abnormal   Collection Time: 07/12/17  5:33 AM  Result Value Ref Range   Sodium 140 135 - 145 mmol/L   Potassium 3.9 3.5 - 5.1 mmol/L   Chloride 111 101 - 111 mmol/L   CO2 23 22 - 32 mmol/L   Glucose, Bld 141 (H) 65 - 99 mg/dL   BUN 8 6 - 20 mg/dL   Creatinine, Ser 0.69 0.44 - 1.00 mg/dL   Calcium 8.6 (L) 8.9 - 10.3 mg/dL   GFR calc non Af Amer >60 >60 mL/min   GFR calc Af Amer >60 >60  mL/min   Anion gap 6 5 - 15  CBC     Status: Abnormal   Collection Time: 07/12/17  5:33 AM  Result Value Ref Range   WBC 16.4 (H) 4.0 - 10.5 K/uL   RBC 4.14 3.87 - 5.11 MIL/uL   Hemoglobin 12.8 12.0 - 15.0 g/dL   HCT 37.0 36.0 - 46.0 %   MCV 89.4 78.0 - 100.0 fL   MCH 30.9 26.0 - 34.0 pg   MCHC 34.6 30.0 - 36.0 g/dL   RDW 12.8 11.5 - 15.5 %   Platelets 185 150 - 400 K/uL    Studies/Results: No results found.    Assessment: Crohn's disease with terminal ileal stricture. This was recently diagnosed. She has responded nicely to steroids for the acute event.  Plan:   Okay from GI standpoint to discharge home. I would send her home on prednisone 40 mg daily. She has an appointment next week to see Dr Watt Climes. He can decide on steroid taper and further treatment.    SAM F Dmya Long 07/12/2017, 10:35 AM  Pager: [phone] If no answer or after 5 PM call  [phone] 

## 2019-01-25 IMAGING — DX DG ABDOMEN 1V
1 series · 1 of 1 positions shown · non-contrast
Comparison: CT abdomen and pelvis June 16, 2017

CLINICAL DATA: Small bowel obstruction.

EXAM:
ABDOMEN - 1 VIEW

[abdomen kub]
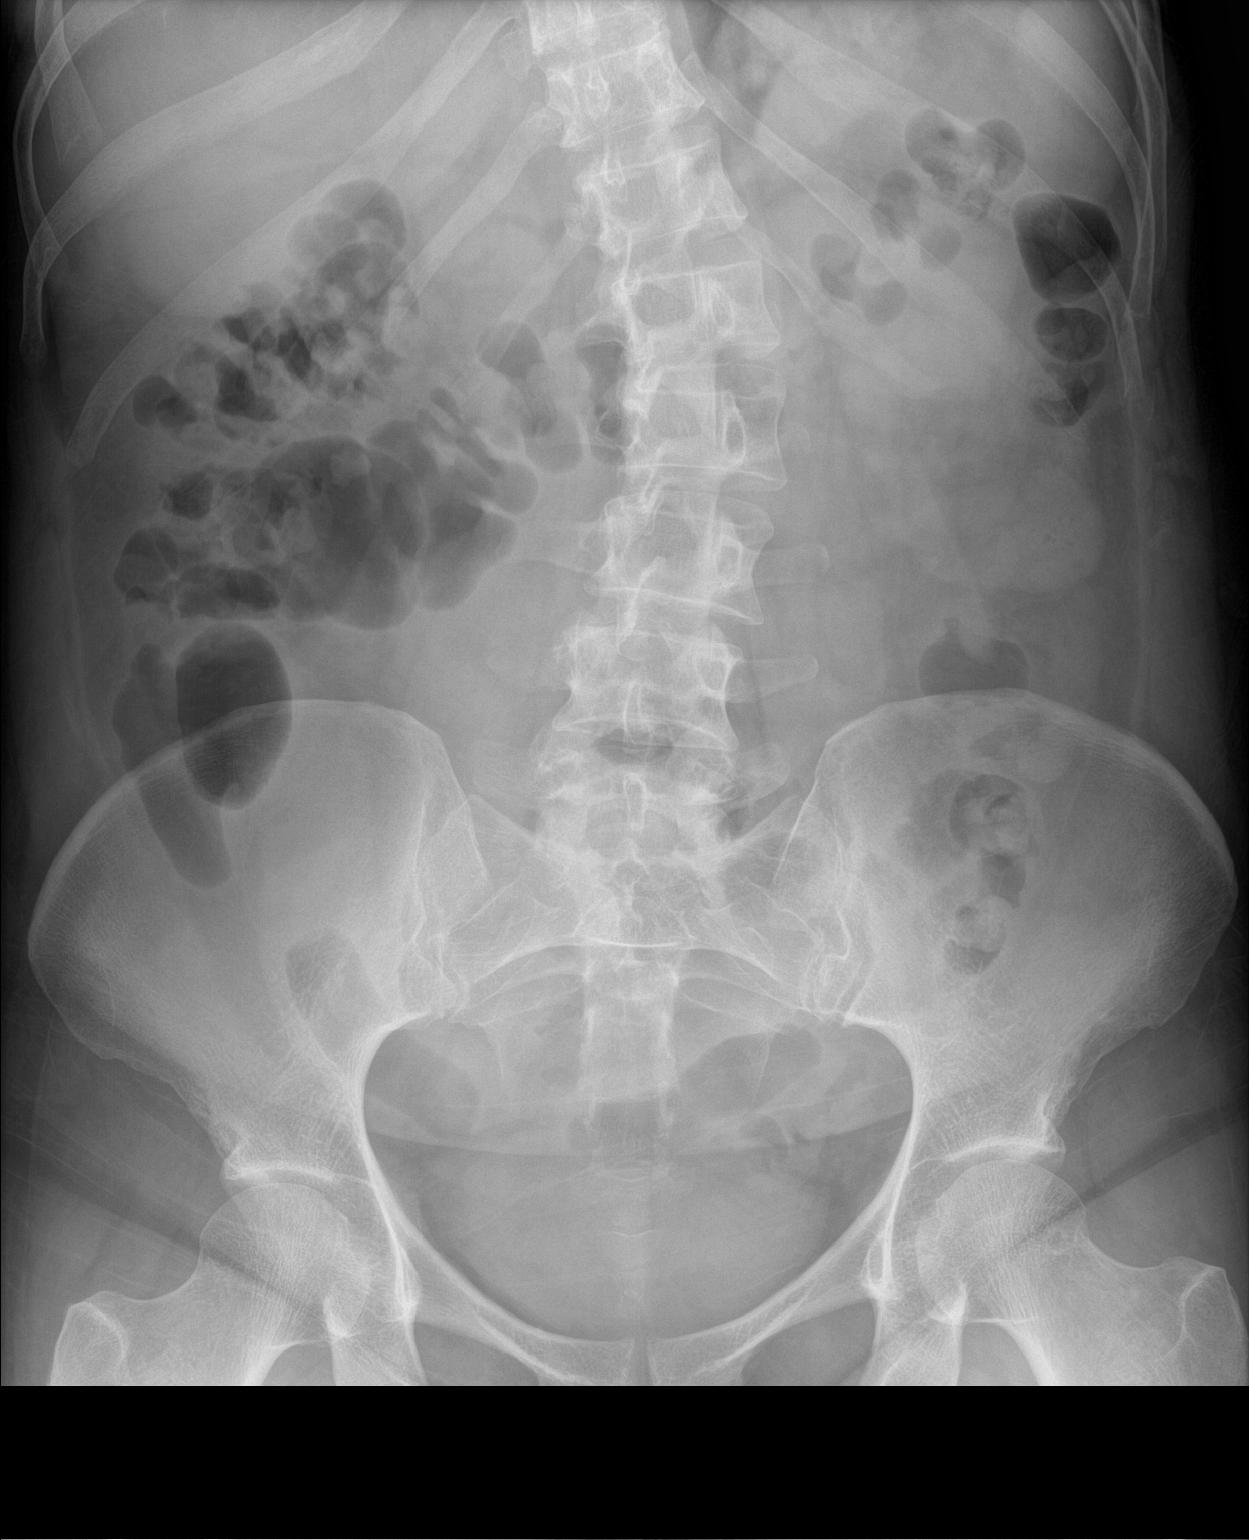

[1 of 1 positions shown; findings below may reference images not displayed]

FINDINGS: Bowel gas pattern is nondilated and nonobstructive with paucity of
small bowel gas. A few loops of featureless air-filled small bowel
in pelvis. No intra-abdominal mass effect or pathologic
calcifications. Levoscoliosis. Lower quadrant.
IMPRESSION: Nonspecific bowel gas pattern. Paucity of small bowel gas
(fluid-filled small bowel obstruction not excluded).

## 2020-07-04 ENCOUNTER — Ambulatory Visit (INDEPENDENT_AMBULATORY_CARE_PROVIDER_SITE_OTHER): Payer: BC Managed Care – PPO | Admitting: Family Medicine

## 2020-07-04 ENCOUNTER — Other Ambulatory Visit: Payer: Self-pay

## 2020-07-04 ENCOUNTER — Ambulatory Visit: Payer: Self-pay

## 2020-07-04 VITALS — BP 112/80 | Ht 62.0 in | Wt 138.0 lb

## 2020-07-04 DIAGNOSIS — M25572 Pain in left ankle and joints of left foot: Secondary | ICD-10-CM

## 2020-07-04 NOTE — Patient Instructions (Signed)
Your ultrasound is reassuring. This is consistent with sinus tarsi syndrome. Ice the area 15 minutes at a time 3-4 times a day. Arch support is very important - wear the Rolena Infante you have - if you don't improve as expected we can consider our green sports insoles - or you can try the short boot to rest this for 1-2 weeks. Voltaren gel up to 4 times a day topically. Ok to take tylenol with this if needed. Follow up with me in 2 weeks for reevaluation.

## 2020-07-05 ENCOUNTER — Encounter: Payer: Self-pay | Admitting: Family Medicine

## 2020-07-05 NOTE — Progress Notes (Signed)
PCP: Hermine Messick, MD  Subjective:   HPI: Patient is a 49 y.o. female here for left ankle/foot pain.  Patient reports about 3 weeks ago she woke up with lateral and plantar left foot pain. No acute injury or increase in activity prior to this though she is on her feet quite a bit. This pain increased in frequency with associated lateral foot swelling and discomfort. Led up to Saturday when pain was more extreme. Has tried icing, elevating, chiropractic care, rolfing. Is limping due to pain.  Past Medical History:  Diagnosis Date  . Cancer ([place])   . Depression   . H pylori ulcer   . Melanoma in situ ([place])   . Migraine     Current Outpatient Medications on File Prior to Visit  Medication Sig Dispense Refill  . acetaminophen (TYLENOL) 500 MG tablet Take 1,000 mg by mouth every 6 (six) hours as needed for mild pain.    . carboxymethylcellulose (REFRESH PLUS) 0.5 % SOLN Place 1 drop into both eyes at bedtime.    . cetirizine (ZYRTEC) 10 MG tablet Take 10 mg by mouth daily.    . cholecalciferol (VITAMIN D) 1000 units tablet Take 5,000 Units by mouth daily.     . cyanocobalamin (,VITAMIN B-12,) 1000 MCG/ML injection Inject 1,000 mcg into the muscle every 14 (fourteen) days.    Marland Kitchen OVER THE COUNTER MEDICATION Take 1 tablet by mouth 2 (two) times daily. Estrogen Balance     . progesterone (PROMETRIUM) 100 MG capsule Take 200 mg by mouth at bedtime.    . Riboflavin (VITAMIN B2 PO) Take 1 tablet by mouth 2 (two) times daily.     . rizatriptan (MAXALT) 10 MG tablet Take 10 mg by mouth.    . sodium chloride (OCEAN) 0.65 % SOLN nasal spray Place 1 spray into both nostrils as needed for congestion.     No current facility-administered medications on file prior to visit.    History reviewed. No pertinent surgical history.  Allergies  Allergen Reactions  . Amitriptyline Hcl Other (See Comments)    Vertigo   . Minocycline Other (See Comments)    Vertigo     Social History    Socioeconomic History  . Marital status: Married    Spouse name: Not on file  . Number of children: Not on file  . Years of education: Not on file  . Highest education level: Not on file  Occupational History  . Not on file  Tobacco Use  . Smoking status: Never Smoker  . Smokeless tobacco: Never Used  Substance and Sexual Activity  . Alcohol use: Yes    Comment: ocassionally  . Drug use: No  . Sexual activity: Not on file  Other Topics Concern  . Not on file  Social History Narrative  . Not on file   Social Determinants of Health   Financial Resource Strain:   . Difficulty of Paying Living Expenses: Not on file  Food Insecurity:   . Worried About Charity fundraiser in the Last Year: Not on file  . Ran Out of Food in the Last Year: Not on file  Transportation Needs:   . Lack of Transportation (Medical): Not on file  . Lack of Transportation (Non-Medical): Not on file  Physical Activity:   . Days of Exercise per Week: Not on file  . Minutes of Exercise per Session: Not on file  Stress:   . Feeling of Stress : Not on file  Social Connections:   .  Frequency of Communication with Friends and Family: Not on file  . Frequency of Social Gatherings with Friends and Family: Not on file  . Attends Religious Services: Not on file  . Active Member of Clubs or Organizations: Not on file  . Attends Archivist Meetings: Not on file  . Marital Status: Not on file  Intimate Partner Violence:   . Fear of Current or Ex-Partner: Not on file  . Emotionally Abused: Not on file  . Physically Abused: Not on file  . Sexually Abused: Not on file    Family History  Problem Relation Age of Onset  . Colon cancer Sister     BP 112/80   Ht 5\' 2"  (1.575 m)   Wt 138 lb (62.6 kg)   BMI 25.24 kg/m   [facility] Adult Exercise 07/04/2020  Frequency of aerobic exercise (# of days/week) 3  Average time in minutes 20  Frequency of strengthening activities (# of  days/week) 5    No flowsheet data found.  Review of Systems: See HPI above.     Objective:  Physical Exam:  Gen: NAD, comfortable in exam room  Left ankle/foot: Mild lateral ankle swelling.  Long arch preserved.  No other gross deformity, ecchymoses FROM with 5/5 strength without pain. TTP greatest over sinus tarsi.  She does have mild diffuse tenderness however throughout dorsal foot, lateral ankle, plantar midfoot. Negative ant drawer and negative talar tilt.   Negative syndesmotic compression. Thompsons test negative. NV intact distally.  MSK u/s left ankle/foot:  No cortical irregularity noted of malleoli, metatarsals.  Peroneal tendons normal.  Talar dome normal.  No ankle effusion.  No other abnormalities identified.   Assessment & Plan:  1. Left ankle/foot pain:  Has some pain diffusely possibly related to rolfing causing some mild swelling through foot.  Exam and ultrasound reassuring.  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  Icing, arch support, voltaren gel.  Tylenol if needed.  F/u in 2 weeks for reevaluation, consideration of repeat ultrasound.

## 2020-07-16 ENCOUNTER — Encounter (HOSPITAL_BASED_OUTPATIENT_CLINIC_OR_DEPARTMENT_OTHER): Payer: Self-pay | Admitting: Emergency Medicine

## 2020-07-16 ENCOUNTER — Telehealth (HOSPITAL_COMMUNITY): Payer: Self-pay

## 2020-07-16 ENCOUNTER — Emergency Department (HOSPITAL_BASED_OUTPATIENT_CLINIC_OR_DEPARTMENT_OTHER): Payer: BC Managed Care – PPO

## 2020-07-16 ENCOUNTER — Emergency Department (HOSPITAL_BASED_OUTPATIENT_CLINIC_OR_DEPARTMENT_OTHER)
Admission: EM | Admit: 2020-07-16 | Discharge: 2020-07-16 | Disposition: A | Discharge status: Home / Self Care | Payer: BC Managed Care – PPO | Attending: Emergency Medicine | Admitting: Emergency Medicine

## 2020-07-16 ENCOUNTER — Other Ambulatory Visit: Payer: Self-pay

## 2020-07-16 DIAGNOSIS — U071 COVID-19: Secondary | ICD-10-CM | POA: Insufficient documentation

## 2020-07-16 DIAGNOSIS — Z859 Personal history of malignant neoplasm, unspecified: Secondary | ICD-10-CM | POA: Insufficient documentation

## 2020-07-16 DIAGNOSIS — J1282 Pneumonia due to coronavirus disease 2019: Secondary | ICD-10-CM | POA: Diagnosis not present

## 2020-07-16 DIAGNOSIS — R059 Cough, unspecified: Secondary | ICD-10-CM | POA: Diagnosis present

## 2020-07-16 LAB — RESP PANEL BY RT-PCR (FLU A&B, COVID) ARPGX2
Influenza A by PCR: NEGATIVE
Influenza B by PCR: NEGATIVE
SARS Coronavirus 2 by RT PCR: POSITIVE — AB

## 2020-07-16 MED ORDER — HYDROCOD POLST-CPM POLST ER 10-8 MG/5ML PO SUER: 5.0000 mL | Freq: Two times a day (BID) | ORAL | 0 refills | Status: AC | PRN

## 2020-07-16 MED ORDER — DEXAMETHASONE SODIUM PHOSPHATE 10 MG/ML IJ SOLN
10.0000 mg | Freq: Once | INTRAMUSCULAR | Status: AC
  Administered 2020-07-16: 12:00:00 10 mg via INTRAMUSCULAR
  Filled 2020-07-16: qty 1

## 2020-07-16 NOTE — ED Provider Notes (Signed)
[facility] EMERGENCY DEPARTMENT Provider Note   CSN: [id number] Arrival date & time: 07/16/20  1054     History Chief Complaint  Patient presents with   Cough    Raven Sharp is a 49 y.o. female.  Pt presents to the ED today with a cough which has been ongoing for 11 days.  She has had intermittent fevers mainly in the afternoon.  She has taken several otc meds without improvement.  She has been vaccinated against Covid (last dose at the end of October).        Past Medical History:  Diagnosis Date   Cancer [facility])    Depression    H pylori ulcer    Melanoma in situ [facility])    Migraine     Patient Active Problem List   Diagnosis Date Noted   Exacerbation of Crohn's disease with intestinal obstruction ([place]) 07/11/2017   Leukocytosis 07/11/2017   SBO (small bowel obstruction) ([place]) 06/16/2017   ACUTE BRONCHITIS 09/29/2010   DIZZINESS 07/13/2008   DEPRESSION 06/03/2008   COMMON MIGRAINE 06/03/2008   HAIR LOSS 06/03/2008   FATIGUE 06/03/2008    History reviewed. No pertinent surgical history.   OB History   No obstetric history on file.     Family History  Problem Relation Age of Onset   Colon cancer Sister     Social History   Tobacco Use   Smoking status: Never Smoker   Smokeless tobacco: Never Used  Substance Use Topics   Alcohol use: Yes    Comment: ocassionally   Drug use: No    Home Medications Prior to Admission medications   Medication Sig Start Date End Date Taking? Authorizing Provider  acetaminophen (TYLENOL) 500 MG tablet Take 1,000 mg by mouth every 6 (six) hours as needed for mild pain.    [provider]  carboxymethylcellulose (REFRESH PLUS) 0.5 % SOLN Place 1 drop into both eyes at bedtime.    [provider]  cetirizine (ZYRTEC) 10 MG tablet Take 10 mg by mouth daily.    [provider]  chlorpheniramine-HYDROcodone (TUSSIONEX PENNKINETIC ER) 10-8 MG/5ML SUER Take 5 mLs by  mouth every 12 (twelve) hours as needed for cough. 07/16/20   Isla Pence, MD  cholecalciferol (VITAMIN D) 1000 units tablet Take 5,000 Units by mouth daily.     [provider]  cyanocobalamin (,VITAMIN B-12,) 1000 MCG/ML injection Inject 1,000 mcg into the muscle every 14 (fourteen) days. 06/09/20   [provider]  OVER THE COUNTER MEDICATION Take 1 tablet by mouth 2 (two) times daily. Estrogen Balance     [provider]  progesterone (PROMETRIUM) 100 MG capsule Take 200 mg by mouth at bedtime. 06/14/20   [provider]  Riboflavin (VITAMIN B2 PO) Take 1 tablet by mouth 2 (two) times daily.     [provider]  rizatriptan (MAXALT) 10 MG tablet Take 10 mg by mouth.    [provider]  sodium chloride (OCEAN) 0.65 % SOLN nasal spray Place 1 spray into both nostrils as needed for congestion.    [provider]    Allergies    Amitriptyline hcl and Minocycline  Review of Systems   Review of Systems  Respiratory: Positive for cough.   All other systems reviewed and are negative.   Physical Exam Updated Vital Signs BP 120/73 (BP Location: Right Arm)    Pulse 80    Temp 98 F (36.7 C) (Oral)    Resp 18  Ht 5\' 2"  (1.575 m)    Wt 60.8 kg    LMP 07/05/2020    SpO2 100%    BMI 24.51 kg/m   Physical Exam Vitals and nursing note reviewed.  Constitutional:      Appearance: Normal appearance.  HENT:     Head: Normocephalic and atraumatic.     Right Ear: External ear normal.     Left Ear: External ear normal.     Nose: Nose normal.     Mouth/Throat:     Mouth: Mucous membranes are moist.     Pharynx: Oropharynx is clear.  Eyes:     Extraocular Movements: Extraocular movements intact.     Conjunctiva/sclera: Conjunctivae normal.     Pupils: Pupils are equal, round, and reactive to light.  Cardiovascular:     Rate and Rhythm: Normal rate and regular rhythm.     Pulses: Normal pulses.     Heart sounds: Normal heart  sounds.  Pulmonary:     Effort: Pulmonary effort is normal.     Breath sounds: Normal breath sounds.  Abdominal:     General: Abdomen is flat. Bowel sounds are normal.     Palpations: Abdomen is soft.  Musculoskeletal:        General: Normal range of motion.     Cervical back: Normal range of motion and neck supple.  Skin:    General: Skin is warm.     Capillary Refill: Capillary refill takes less than 2 seconds.  Neurological:     General: No focal deficit present.     Mental Status: She is alert and oriented to person, place, and time.  Psychiatric:        Mood and Affect: Mood normal.        Behavior: Behavior normal.     ED Results / Procedures / Treatments   Labs (all labs ordered are listed, but only abnormal results are displayed) Labs Reviewed  RESP PANEL BY RT-PCR (FLU A&B, COVID) ARPGX2 - Abnormal; Notable for the following components:      Result Value   SARS Coronavirus 2 by RT PCR POSITIVE (*)    All other components within normal limits    EKG None  Radiology DG Chest Portable 1 View  Result Date: 07/16/2020 CLINICAL DATA:  Shortness of breath and cough EXAM: PORTABLE CHEST 1 VIEW COMPARISON:  April 19, 2017 FINDINGS: There is subtle ill-defined opacity in the right base. Lungs elsewhere clear. Heart size and pulmonary vascularity are normal. No adenopathy. There is midthoracic dextroscoliosis with thoracolumbar levoscoliosis. IMPRESSION: Small area of airspace opacity right base, likely developing pneumonia. Lungs otherwise clear. Heart size normal. Stable scoliosis. Electronically Signed   By: Lowella Grip III M.D.   On: 07/16/2020 11:35    Procedures Procedures (including critical care time)  Medications Ordered in ED Medications  dexamethasone (DECADRON) injection 10 mg (has no administration in time range)    ED Course  I have reviewed the triage vital signs and the nursing notes.  Pertinent labs & imaging results that were available  during my care of the patient were reviewed by me and considered in my medical decision making (see chart for details).    MDM Rules/Calculators/A&P                          Pt is positive for Covid.  She does not meet criteria for MAB because her sx have been going on for 11 days  and she is without any significant health problems other than Crohn's disease, but she is not on any immunosuppressive meds.  Pt is oxygenating well and looks nontoxic.  She is stable for d/c and is instructed to return if worse.  F/u with pcp.  MAKHIA VOSLER was evaluated in Emergency Department on 07/16/2020 for the symptoms described in the history of present illness. She was evaluated in the context of the global COVID-19 pandemic, which necessitated consideration that the patient might be at risk for infection with the SARS-CoV-2 virus that causes COVID-19. Institutional protocols and algorithms that pertain to the evaluation of patients at risk for COVID-19 are in a state of rapid change based on information released by regulatory bodies including the CDC and federal and state organizations. These policies and algorithms were followed during the patient's care in the ED. Final Clinical Impression(s) / ED Diagnoses Final diagnoses:  Pneumonia due to COVID-19 virus    Rx / DC Orders ED Discharge Orders         Ordered    chlorpheniramine-HYDROcodone (TUSSIONEX PENNKINETIC ER) 10-8 MG/5ML SUER  Every 12 hours PRN        07/16/20 1219           Isla Pence, MD 07/16/20 1221

## 2020-07-16 NOTE — Telephone Encounter (Signed)
Called to Discuss with patient about Covid symptoms and the use of the monoclonal antibody infusion for those with mild to moderate Covid symptoms and at a high risk of hospitalization.     Pt appears to qualify for this infusion due to co-morbid conditions and/or a member of an at-risk group in accordance with the FDA Emergency Use Authorization.    Unable to reach pt    

## 2020-07-16 NOTE — ED Triage Notes (Signed)
Cough x 11 days. Intermittent fever.

## 2020-07-18 ENCOUNTER — Ambulatory Visit: Payer: BC Managed Care – PPO | Admitting: Family Medicine

## 2022-11-05 ENCOUNTER — Ambulatory Visit
Admission: RE | Admit: 2022-11-05 | Discharge: 2022-11-05 | Disposition: A | Discharge status: Home / Self Care | Payer: BC Managed Care – PPO | Source: Ambulatory Visit | Attending: Family Medicine | Admitting: Family Medicine

## 2022-11-05 ENCOUNTER — Other Ambulatory Visit: Payer: Self-pay | Admitting: Family Medicine

## 2022-11-05 DIAGNOSIS — M25562 Pain in left knee: Secondary | ICD-10-CM

## 2022-11-07 ENCOUNTER — Other Ambulatory Visit: Payer: Self-pay | Admitting: Family Medicine

## 2022-11-07 DIAGNOSIS — Z780 Asymptomatic menopausal state: Secondary | ICD-10-CM

## 2023-05-14 ENCOUNTER — Other Ambulatory Visit: Payer: BC Managed Care – PPO

## 2024-09-01 ENCOUNTER — Other Ambulatory Visit: Payer: Self-pay | Admitting: Family Medicine

## 2024-09-01 DIAGNOSIS — N950 Postmenopausal bleeding: Secondary | ICD-10-CM

## 2024-09-09 ENCOUNTER — Ambulatory Visit
Admission: RE | Admit: 2024-09-09 | Discharge: 2024-09-09 | Disposition: A | Source: Ambulatory Visit | Attending: Family Medicine | Admitting: Family Medicine

## 2024-09-09 DIAGNOSIS — N950 Postmenopausal bleeding: Secondary | ICD-10-CM
